# Patient Record
Sex: Male | Born: 1955 | Race: Black or African American | Hispanic: No | Marital: Married | Smoking: Never smoker
Health system: Southern US, Community
[De-identification: ages and names within clinical notes are randomized; demographics above are authoritative.]

## PROBLEM LIST (undated history)

## (undated) DIAGNOSIS — T7840XA Allergy, unspecified, initial encounter: Secondary | ICD-10-CM

## (undated) DIAGNOSIS — K219 Gastro-esophageal reflux disease without esophagitis: Secondary | ICD-10-CM

## (undated) DIAGNOSIS — E785 Hyperlipidemia, unspecified: Secondary | ICD-10-CM

## (undated) HISTORY — DX: Hyperlipidemia, unspecified: E78.5

## (undated) HISTORY — DX: Gastro-esophageal reflux disease without esophagitis: K21.9

## (undated) HISTORY — DX: Allergy, unspecified, initial encounter: T78.40XA

## (undated) HISTORY — PX: TONSILLECTOMY AND ADENOIDECTOMY: SUR1326

## (undated) HISTORY — PX: OTHER SURGICAL HISTORY: SHX169

---

## 1985-09-16 HISTORY — PX: PALATE / UVULA BIOPSY / EXCISION: SUR128

## 1999-06-19 ENCOUNTER — Emergency Department (HOSPITAL_COMMUNITY): Admission: EM | Admit: 1999-06-19 | Discharge: 1999-06-19 | Payer: Self-pay | Admitting: Emergency Medicine

## 1999-06-19 ENCOUNTER — Encounter: Payer: Self-pay | Admitting: Emergency Medicine

## 2000-08-16 ENCOUNTER — Emergency Department (HOSPITAL_COMMUNITY): Admission: EM | Admit: 2000-08-16 | Discharge: 2000-08-16 | Payer: Self-pay | Admitting: Internal Medicine

## 2003-12-05 ENCOUNTER — Encounter: Admission: RE | Admit: 2003-12-05 | Discharge: 2003-12-05 | Payer: Self-pay | Admitting: Internal Medicine

## 2004-03-30 ENCOUNTER — Encounter: Payer: Self-pay | Admitting: Internal Medicine

## 2004-08-03 ENCOUNTER — Ambulatory Visit: Payer: Self-pay | Admitting: Family Medicine

## 2005-05-10 ENCOUNTER — Ambulatory Visit: Payer: Self-pay | Admitting: Internal Medicine

## 2005-05-23 ENCOUNTER — Ambulatory Visit: Payer: Self-pay | Admitting: Internal Medicine

## 2006-02-18 ENCOUNTER — Ambulatory Visit: Payer: Self-pay | Admitting: Internal Medicine

## 2006-03-20 ENCOUNTER — Ambulatory Visit: Payer: Self-pay | Admitting: Internal Medicine

## 2006-06-04 ENCOUNTER — Ambulatory Visit: Payer: Self-pay | Admitting: Internal Medicine

## 2006-06-11 ENCOUNTER — Ambulatory Visit: Payer: Self-pay | Admitting: Internal Medicine

## 2006-12-24 ENCOUNTER — Ambulatory Visit: Payer: Self-pay | Admitting: Internal Medicine

## 2006-12-24 LAB — CONVERTED CEMR LAB
ALT: 65 units/L — ABNORMAL HIGH (ref 0–40)
AST: 43 units/L — ABNORMAL HIGH (ref 0–37)
Cholesterol: 166 mg/dL (ref 0–200)
HDL: 45.2 mg/dL (ref >39.0)
Hgb A1c MFr Bld: 6 % (ref 4.6–6.0)
LDL Cholesterol: 101 mg/dL — ABNORMAL HIGH (ref 0–99)
Total CHOL/HDL Ratio: 3.7
Triglycerides: 100 mg/dL (ref 0–149)
VLDL: 20 mg/dL (ref 0–40)

## 2007-01-07 ENCOUNTER — Ambulatory Visit: Payer: Self-pay | Admitting: Internal Medicine

## 2007-05-04 ENCOUNTER — Ambulatory Visit: Payer: Self-pay | Admitting: Internal Medicine

## 2007-05-12 ENCOUNTER — Encounter (INDEPENDENT_AMBULATORY_CARE_PROVIDER_SITE_OTHER): Payer: Self-pay | Admitting: *Deleted

## 2007-05-12 ENCOUNTER — Ambulatory Visit: Payer: Self-pay | Admitting: Internal Medicine

## 2007-05-12 LAB — CONVERTED CEMR LAB
ALT: 30 units/L (ref 0–53)
AST: 27 units/L (ref 0–37)
Cholesterol, target level: 200 mg/dL
Cholesterol: 121 mg/dL (ref 0–200)
Creatinine,U: 299.4 mg/dL
HDL goal, serum: 40 mg/dL
HDL: 39.9 mg/dL (ref >39.0)
Hgb A1c MFr Bld: 6 % (ref 4.6–6.0)
LDL Cholesterol: 72 mg/dL (ref 0–99)
LDL Goal: 130 mg/dL
Microalb Creat Ratio: 2.7 mg/g (ref 0.0–30.0)
Microalb, Ur: 0.8 mg/dL (ref 0.0–1.9)
PSA: 0.27 ng/mL (ref 0.10–4.00)
Total CHOL/HDL Ratio: 3
Triglycerides: 45 mg/dL (ref 0–149)
VLDL: 9 mg/dL (ref 0–40)

## 2007-08-18 ENCOUNTER — Telehealth (INDEPENDENT_AMBULATORY_CARE_PROVIDER_SITE_OTHER): Payer: Self-pay | Admitting: *Deleted

## 2008-05-11 ENCOUNTER — Ambulatory Visit: Payer: Self-pay | Admitting: Internal Medicine

## 2008-05-16 LAB — CONVERTED CEMR LAB
Cholesterol: 208 mg/dL (ref 0–200)
Direct LDL: 117.4 mg/dL
HDL: 52.5 mg/dL (ref >39.0)
Hgb A1c MFr Bld: 6 % (ref 4.6–6.0)
Total CHOL/HDL Ratio: 4
Triglycerides: 113 mg/dL (ref 0–149)
VLDL: 23 mg/dL (ref 0–40)

## 2008-05-17 ENCOUNTER — Encounter (INDEPENDENT_AMBULATORY_CARE_PROVIDER_SITE_OTHER): Payer: Self-pay | Admitting: *Deleted

## 2008-05-26 ENCOUNTER — Encounter (INDEPENDENT_AMBULATORY_CARE_PROVIDER_SITE_OTHER): Payer: Self-pay | Admitting: *Deleted

## 2008-05-26 ENCOUNTER — Ambulatory Visit: Payer: Self-pay | Admitting: Internal Medicine

## 2008-05-26 DIAGNOSIS — E785 Hyperlipidemia, unspecified: Secondary | ICD-10-CM | POA: Insufficient documentation

## 2008-05-26 DIAGNOSIS — J309 Allergic rhinitis, unspecified: Secondary | ICD-10-CM | POA: Insufficient documentation

## 2008-05-26 DIAGNOSIS — R7301 Impaired fasting glucose: Secondary | ICD-10-CM | POA: Insufficient documentation

## 2008-05-26 DIAGNOSIS — R5381 Other malaise: Secondary | ICD-10-CM

## 2008-05-26 DIAGNOSIS — R5383 Other fatigue: Secondary | ICD-10-CM

## 2008-05-26 LAB — CONVERTED CEMR LAB
Cholesterol, target level: 200 mg/dL
HDL goal, serum: 40 mg/dL
LDL Goal: 100 mg/dL

## 2008-05-27 ENCOUNTER — Encounter (INDEPENDENT_AMBULATORY_CARE_PROVIDER_SITE_OTHER): Payer: Self-pay | Admitting: *Deleted

## 2008-05-27 LAB — CONVERTED CEMR LAB
Basophils Absolute: 0.1 10*3/uL (ref 0.0–0.1)
Basophils Relative: 1.3 % (ref 0.0–3.0)
Eosinophils Absolute: 0.1 10*3/uL (ref 0.0–0.7)
Eosinophils Relative: 1.7 % (ref 0.0–5.0)
Free T4: 0.8 ng/dL (ref 0.6–1.6)
HCT: 42.1 % (ref 39.0–52.0)
Hemoglobin: 14.5 g/dL (ref 13.0–17.0)
Lymphocytes Relative: 38 % (ref 12.0–46.0)
MCHC: 34.3 g/dL (ref 30.0–36.0)
MCV: 93.3 fL (ref 78.0–100.0)
Monocytes Absolute: 0.3 10*3/uL (ref 0.1–1.0)
Monocytes Relative: 8 % (ref 3.0–12.0)
Neutro Abs: 1.9 10*3/uL (ref 1.4–7.7)
Neutrophils Relative %: 51 % (ref 43.0–77.0)
PSA: 0.3 ng/mL (ref 0.10–4.00)
Platelets: 162 10*3/uL (ref 150–400)
RBC: 4.51 M/uL (ref 4.22–5.81)
RDW: 12.5 % (ref 11.5–14.6)
TSH: 0.97 microintl units/mL (ref 0.35–5.50)
WBC: 3.9 10*3/uL — ABNORMAL LOW (ref 4.5–10.5)

## 2008-05-31 ENCOUNTER — Encounter: Payer: Self-pay | Admitting: Internal Medicine

## 2008-06-03 ENCOUNTER — Ambulatory Visit: Payer: Self-pay | Admitting: *Deleted

## 2008-06-27 ENCOUNTER — Ambulatory Visit: Payer: Self-pay | Admitting: Gastroenterology

## 2008-07-08 ENCOUNTER — Encounter: Payer: Self-pay | Admitting: Gastroenterology

## 2008-07-08 ENCOUNTER — Ambulatory Visit: Payer: Self-pay | Admitting: Gastroenterology

## 2008-07-11 ENCOUNTER — Encounter: Payer: Self-pay | Admitting: Gastroenterology

## 2008-08-16 ENCOUNTER — Ambulatory Visit: Payer: Self-pay | Admitting: Internal Medicine

## 2008-08-19 ENCOUNTER — Telehealth (INDEPENDENT_AMBULATORY_CARE_PROVIDER_SITE_OTHER): Payer: Self-pay | Admitting: *Deleted

## 2008-10-19 ENCOUNTER — Ambulatory Visit: Payer: Self-pay | Admitting: Internal Medicine

## 2008-10-19 DIAGNOSIS — IMO0002 Reserved for concepts with insufficient information to code with codable children: Secondary | ICD-10-CM

## 2008-10-19 LAB — CONVERTED CEMR LAB
Bilirubin Urine: NEGATIVE
Blood in Urine, dipstick: NEGATIVE
Glucose, Urine, Semiquant: NEGATIVE
Ketones, urine, test strip: NEGATIVE
Nitrite: NEGATIVE
Protein, U semiquant: NEGATIVE
Specific Gravity, Urine: 1.015
Urobilinogen, UA: 0.2
WBC Urine, dipstick: NEGATIVE
pH: 6

## 2009-07-04 ENCOUNTER — Ambulatory Visit: Payer: Self-pay | Admitting: Family Medicine

## 2009-07-19 ENCOUNTER — Ambulatory Visit: Payer: Self-pay | Admitting: Internal Medicine

## 2009-07-21 ENCOUNTER — Encounter (INDEPENDENT_AMBULATORY_CARE_PROVIDER_SITE_OTHER): Payer: Self-pay | Admitting: *Deleted

## 2009-10-24 ENCOUNTER — Telehealth (INDEPENDENT_AMBULATORY_CARE_PROVIDER_SITE_OTHER): Payer: Self-pay | Admitting: *Deleted

## 2009-10-24 ENCOUNTER — Encounter (INDEPENDENT_AMBULATORY_CARE_PROVIDER_SITE_OTHER): Payer: Self-pay | Admitting: *Deleted

## 2009-11-06 ENCOUNTER — Telehealth (INDEPENDENT_AMBULATORY_CARE_PROVIDER_SITE_OTHER): Payer: Self-pay | Admitting: *Deleted

## 2009-11-10 ENCOUNTER — Ambulatory Visit: Payer: Self-pay | Admitting: Internal Medicine

## 2009-11-10 LAB — CONVERTED CEMR LAB
Alkaline Phosphatase: 38 units/L — ABNORMAL LOW (ref 39–117)
BUN: 16 mg/dL (ref 6–23)
Basophils Absolute: 0 10*3/uL (ref 0.0–0.1)
Bilirubin, Direct: 0 mg/dL (ref 0.0–0.3)
Blood in Urine, dipstick: NEGATIVE
CO2: 28 meq/L (ref 19–32)
Chloride: 108 meq/L (ref 96–112)
Creatinine, Ser: 1.2 mg/dL (ref 0.4–1.5)
Eosinophils Absolute: 0.1 10*3/uL (ref 0.0–0.7)
Eosinophils Relative: 2.3 % (ref 0.0–5.0)
Glucose, Bld: 95 mg/dL (ref 70–99)
HCT: 43.5 % (ref 39.0–52.0)
HDL: 56.9 mg/dL (ref >39.00)
Hgb A1c MFr Bld: 5.9 % (ref 4.6–6.5)
Ketones, urine, test strip: NEGATIVE
Lymphs Abs: 1.5 10*3/uL (ref 0.7–4.0)
MCHC: 32.8 g/dL (ref 30.0–36.0)
MCV: 95.2 fL (ref 78.0–100.0)
Microalb Creat Ratio: 3.3 mg/g (ref 0.0–30.0)
Monocytes Absolute: 0.3 10*3/uL (ref 0.1–1.0)
Neutrophils Relative %: 50.9 % (ref 43.0–77.0)
Nitrite: NEGATIVE
PSA: 0.47 ng/mL (ref 0.10–4.00)
Platelets: 156 10*3/uL (ref 150.0–400.0)
Potassium: 3.9 meq/L (ref 3.5–5.1)
Protein, U semiquant: NEGATIVE
RDW: 12.5 % (ref 11.5–14.6)
Specific Gravity, Urine: 1.02
Total Bilirubin: 0.4 mg/dL (ref 0.3–1.2)
Total CHOL/HDL Ratio: 3
Total Protein: 7.3 g/dL (ref 6.0–8.3)
Triglycerides: 112 mg/dL (ref 0.0–149.0)
VLDL: 22.4 mg/dL (ref 0.0–40.0)
WBC Urine, dipstick: NEGATIVE
WBC: 3.8 10*3/uL — ABNORMAL LOW (ref 4.5–10.5)
pH: 5

## 2009-11-16 ENCOUNTER — Ambulatory Visit: Payer: Self-pay | Admitting: Internal Medicine

## 2009-11-16 DIAGNOSIS — D126 Benign neoplasm of colon, unspecified: Secondary | ICD-10-CM

## 2010-01-26 IMAGING — CR DG CHEST 2V
2 series · 2 of 2 positions shown · non-contrast
Comparison: None

CLINICAL DATA: Bronchitis

CHEST - 2 VIEW

[view not recorded (1 of 2)]
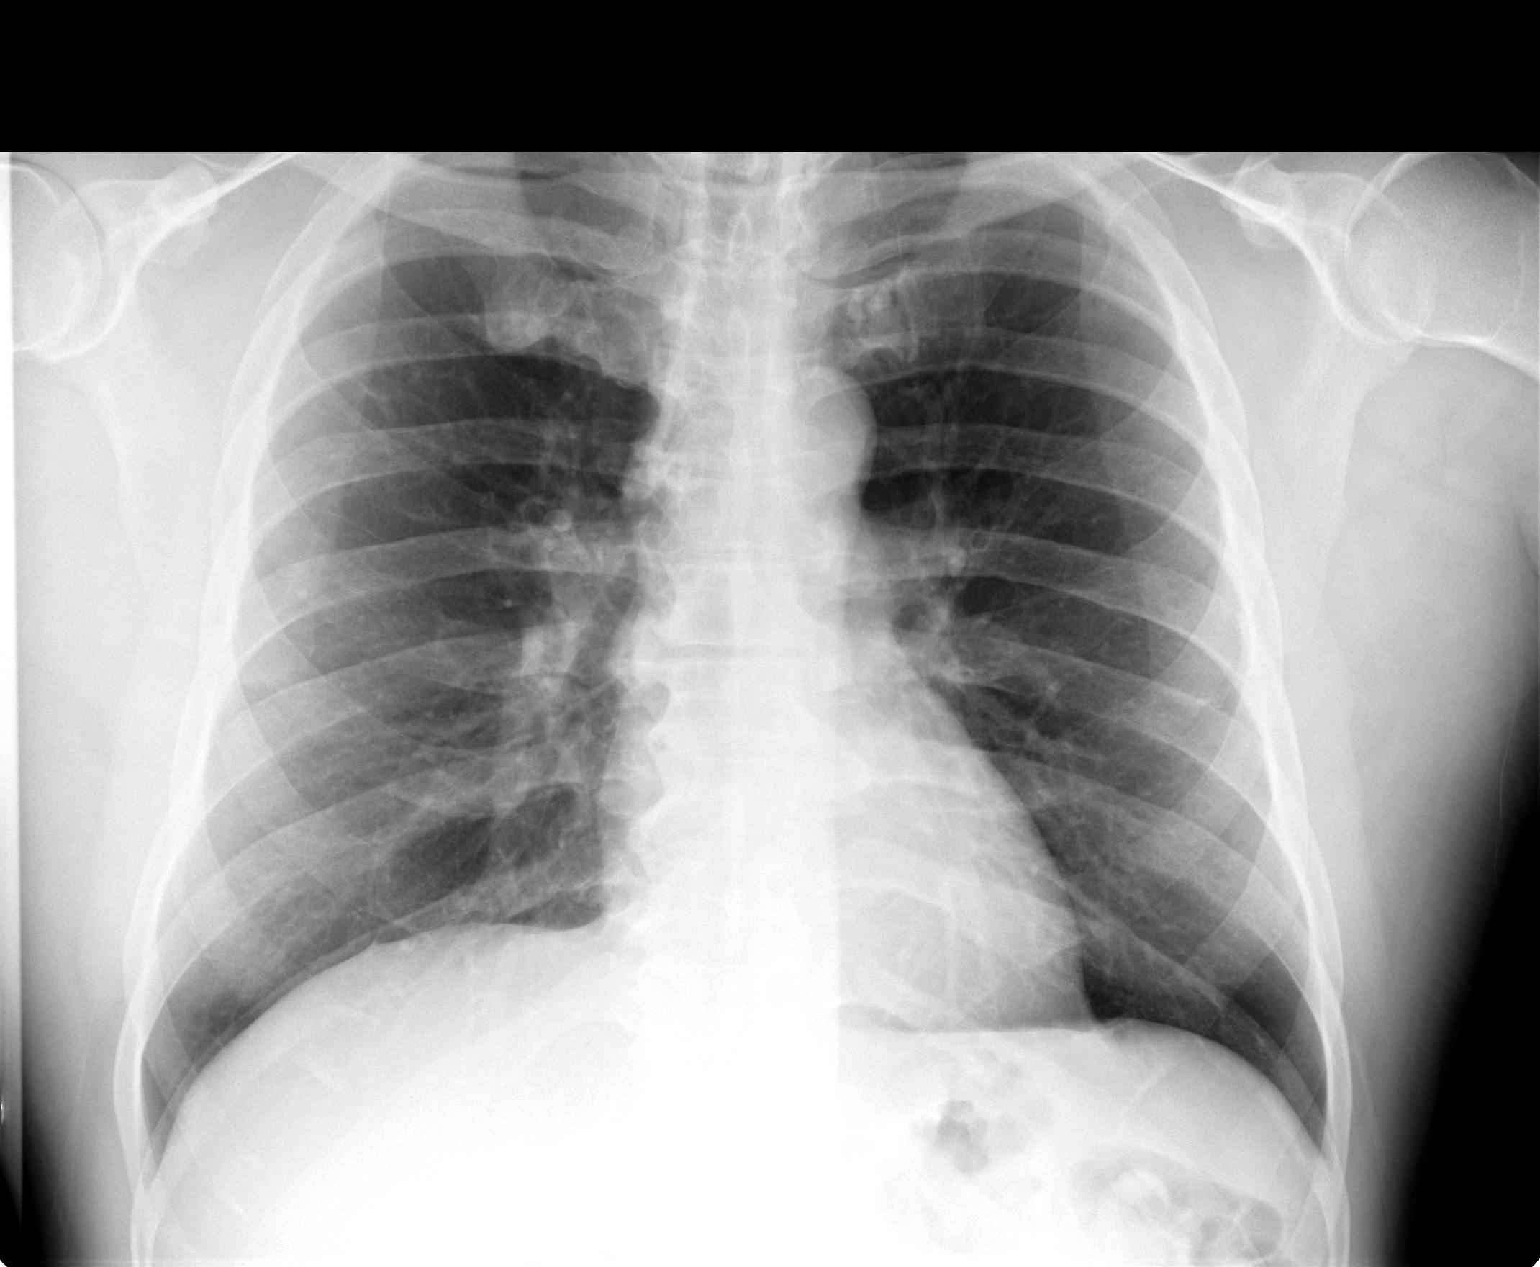

[view not recorded (2 of 2)]
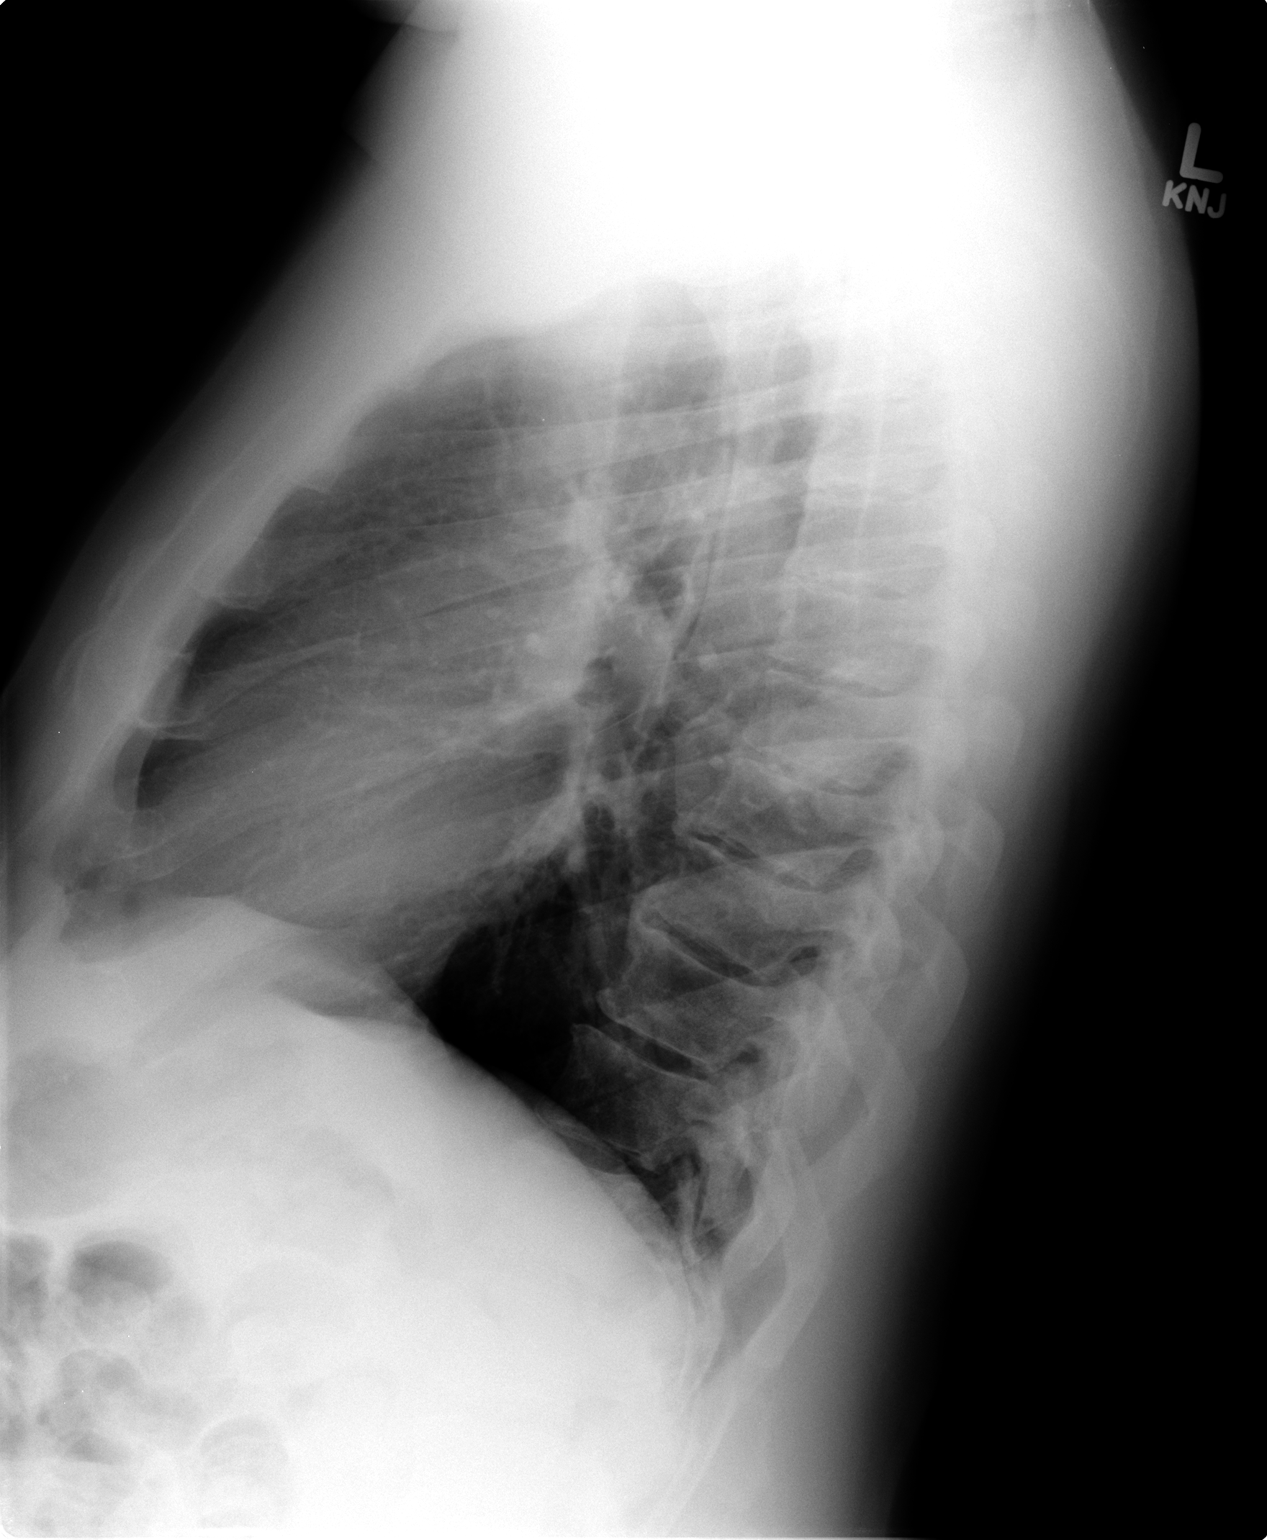

[2 of 2 positions shown; findings below may reference images not displayed]

FINDINGS: The heart size and mediastinal contours are within normal limits.
Both lungs are clear.  The visualized skeletal structures are
unremarkable.
IMPRESSION: No active disease.

## 2010-02-14 ENCOUNTER — Ambulatory Visit: Payer: Self-pay | Admitting: Internal Medicine

## 2010-02-14 DIAGNOSIS — M5412 Radiculopathy, cervical region: Secondary | ICD-10-CM | POA: Insufficient documentation

## 2010-04-22 ENCOUNTER — Emergency Department (HOSPITAL_BASED_OUTPATIENT_CLINIC_OR_DEPARTMENT_OTHER): Admission: EM | Admit: 2010-04-22 | Discharge: 2010-04-22 | Payer: Self-pay | Admitting: Emergency Medicine

## 2010-09-14 ENCOUNTER — Ambulatory Visit
Admission: RE | Admit: 2010-09-14 | Discharge: 2010-09-14 | Payer: Self-pay | Source: Home / Self Care | Attending: Internal Medicine | Admitting: Internal Medicine

## 2010-09-14 DIAGNOSIS — J019 Acute sinusitis, unspecified: Secondary | ICD-10-CM | POA: Insufficient documentation

## 2010-09-14 LAB — CONVERTED CEMR LAB: Rapid Strep: NEGATIVE

## 2010-10-16 NOTE — Assessment & Plan Note (Signed)
Summary: cpx/kdc   Vital Signs:  Patient profile:   55 year old male Height:      68 inches Weight:      200.4 pounds Temp:     98.7 degrees F oral Resp:     14 per minute BP sitting:   98 / 60  (left arm) Cuff size:   large  Vitals Entered By: Shonna Chock (November 16, 2009 3:55 PM)  Comments REVIEWED MED LIST, PATIENT AGREED DOSE AND INSTRUCTION CORRECT    Primary Care Provider:  Marga Melnick MD   History of Present Illness: Jason Bowman is here for a physical; he is essentially asymptomatic except for nasal congestion, a perrenial issue.  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 ENT:  Complains of nasal congestion and sinus pressure; denies decreased hearing, difficulty swallowing, and hoarseness; No purulence,facial pain or frontal headache. CV:  Denies chest pain or discomfort, leg cramps with exertion, palpitations, and shortness of breath with exertion. Resp:  Denies cough, excessive snoring, hypersomnolence, morning headaches, shortness of breath, sputum productive, and wheezing. GI:  Denies abdominal pain, bloody stools, dark tarry stools, and indigestion. GU:  Denies discharge, dysuria, and hematuria. MS:  Complains of low back pain; denies joint pain, joint redness, joint swelling, mid back pain, and  thoracic pain. Derm:  Denies changes in nail beds, dryness, and hair loss. Neuro:  Denies brief paralysis, numbness, tingling, and weakness; No radicular symptoms. Psych:  Denies anxiety and depression. Endo:  Denies cold intolerance, excessive hunger, excessive thirst, excessive urination, and heat intolerance. Heme:  Denies abnormal bruising and bleeding. Allergy:  Denies itching eyes and sneezing.  Physical Exam  General:  well-nourished,in no acute distress; alert,appropriate and cooperative throughout examination Head:  Normocephalic and atraumatic without obvious abnormalities. Moustache & beard Eyes:  No corneal or conjunctival inflammation noted. Perrla. Funduscopic exam benign, without hemorrhages, exudates or papilledema.  Ears:  External ear exam shows no significant lesions or deformities.  Otoscopic examination reveals clear canals, tympanic membranes are intact bilaterally without bulging, retraction, inflammation or discharge. Hearing is grossly normal bilaterally. Nose:  External nasal examination shows no deformity or inflammation. Nasal mucosa are pink and moist without lesions or exudates. Mouth:  Oral mucosa and oropharynx without lesions or exudates.  Teeth in good repair.S/P posterior pharyngeal surgery Neck:  No deformities, masses, or tenderness noted. Lungs:  Normal respiratory effort, chest expands symmetrically. Lungs are clear to auscultation, no crackles or wheezes. Heart:  Normal rate and regular rhythm. S1 and S2 normal without gallop, murmur, click, rub . S4 Abdomen:  Bowel sounds positive,abdomen soft and non-tender without masses, organomegaly or hernias noted. Rectal:  No external abnormalities noted. Normal sphincter tone. No rectal masses or tenderness. Genitalia:  Testes bilaterally descended without nodularity, tenderness or masses. No scrotal masses or lesions. No penis lesions or urethral discharge. Prostate:  Prostate gland firm and smooth,  no  enlargement, nodularity, tenderness, mass, asymmetry or induration. Msk:  No deformity or scoliosis noted of thoracic or lumbar spine.   Pulses:  R and L carotid,radial,dorsalis pedis and posterior tibial pulses are full and equal bilaterally Extremities:  No clubbing, cyanosis, edema, or deformity noted with normal full range of motion of all joints.  Mild crepitus of knees  Neurologic:  alert & oriented X3, strength normal in all extremities, and DTRs symmetrical , 1/2 + @ knees  Skin:  Small papule L cheek. Hyperpigmentation inguinal areas. Fleshy polyp buttock Cervical Nodes:  No lymphadenopathy noted Axillary Nodes:  No palpable lymphadenopathy Psych:  memory intact for recent and remote, normally interactive, and good eye contact.     Impression & Recommendations:  Problem # 1:  ROUTINE GENERAL MEDICAL EXAM@HEALTH  CARE FACL (ICD-V70.0)  Orders: EKG w/ Interpretation (93000)  Problem # 2:  HYPERLIPIDEMIA (ICD-272.4)  Lipids are @ goal His updated medication list for this problem includes:    Vytorin 10-20 Mg Tabs (Ezetimibe-simvastatin) .Marland Kitchen... 1 at bedtime. appointment due for additional refills  Orders: EKG w/ Interpretation (93000)  Problem # 3:  COLONIC POLYPS, HX OF (ICD-V12.72) Colonoscopy 2015  Problem # 4:  HYPERGLYCEMIA, FASTING (ICD-790.29) FBS now 95, A1c 5.9%  Complete Medication List: 1)  Vytorin 10-20 Mg Tabs (Ezetimibe-simvastatin) .Marland Kitchen.. 1 at bedtime. appointment due for additional refills 2)  Allegra 180 Mg Tabs (Fexofenadine hcl) .Marland Kitchen.. 1 by mouth once daily as needed 3)  Multivitamins Tabs (Multiple vitamin) .Marland Kitchen.. 1 by mouth once daily 4)  Adult Aspirin Low Strength 81 Mg Tbdp (Aspirin) .Marland Kitchen.. 1 by mouth once daily 5)  Rhinocort Aqua 32 Mcg/act Susp (Budesonide) .... As directed  Patient Instructions: 1)  Please schedule a follow-up appointment in 1 year or as needed . Neti pot once daily as needed for sinus congestion Prescriptions: VYTORIN 10-20 MG  TABS  (EZETIMIBE-SIMVASTATIN) 1 at bedtime. APPOINTMENT DUE FOR ADDITIONAL REFILLS  #30 x 11   Entered and Authorized by:   Marga Melnick MD   Signed by:   Marga Melnick MD on 11/16/2009   Method used:   Print then Give to Patient   RxID:   [id number]

## 2010-10-16 NOTE — Assessment & Plan Note (Signed)
Summary: pain in right arm//lch   Vital Signs:  Patient profile:   55 year old male Weight:      195.2 pounds Temp:     98.9 degrees F oral Pulse rate:   72 / minute Resp:     15 per minute BP sitting:   100 / 62  (left arm) Cuff size:   large  Vitals Entered By: Shonna Chock (February 14, 2010 4:26 PM) CC: Right arm pain x 1 month 1/2, no known injury, Back pain Comments REVIEWED MED LIST, PATIENT AGREED DOSE AND INSTRUCTION CORRECT    Primary Care Provider:  Marga Melnick MD  CC:  Right arm pain x 1 month 1/2, no known injury, and Back pain.  History of Present Illness:  Back Pain      This is a 55 year old man who presents with Back pain for > 6 weeks.  The patient reports rest pain, but denies fever, chills, weakness, loss of sensation, fecal incontinence, urinary incontinence, urinary retention, dysuria, inability to work, and inability to care for self.  The pain is located in the mid- right thoracic back.  The pain began at home, gradually, and  possibly after overuse, lifting boxes & sleeping in different , softer bed for 2 months.  The pain radiates to the right UE in ulnar distribution.  The pain is made worse by activity, specifically using RUE such as typing @ compute.  The pain is made better by NSAID medications.  Risk factors for serious underlying conditions include duration of pain > 1 month and age >= 50 years.    Allergies: 1)  Celebrex 2)  * Crestor  Review of Systems General:  Denies chills and weight loss. MS:  Denies joint pain, joint redness, and joint swelling. Derm:  Denies lesion(s) and rash. Neuro:  Denies brief paralysis, numbness, and weakness.  Physical Exam  General:  well-nourished,in no acute distress; alert,appropriate and cooperative throughout examination Neck:  No deformities, masses, or tenderness noted. Full ROM Lungs:  Normal respiratory effort, chest expands symmetrically. Lungs are clear to auscultation, no crackles or wheezes. Heart:   Normal rate and regular rhythm. S1 and S2 normal without gallop, murmur, click, rub . S4 Abdomen:  Bowel sounds positive,abdomen soft and non-tender without masses, organomegaly or hernias noted. Pulses:  R and L carotid,radial  pulses are full and equal bilaterally Extremities:  No clubbing, cyanosis, edema, or deformity noted with normal full range of motion of all joints.   Neurologic:  alert & oriented X3, strength normal in all extremities, gait normal, and DTRs symmetrical and normal.   Skin:  Intact without suspicious lesions or rashes Cervical Nodes:  No lymphadenopathy noted Axillary Nodes:  No palpable lymphadenopathy but ticklish Psych:  memory intact for recent and remote and normally interactive.     Impression & Recommendations:  Problem # 1:  CERVICAL RADICULOPATHY, RIGHT (ICD-723.4)   @ C-8 w/o muscle weakness or abnormal DTRs  Orders: Chiropractic Referral ([place])  Complete Medication List: 1)  Vytorin 10-20 Mg Tabs (Ezetimibe-simvastatin) .Marland Kitchen.. 1 at bedtime. 2)  Allegra 180 Mg Tabs (Fexofenadine hcl) .Marland Kitchen.. 1 by mouth once daily as needed 3)  Multivitamins Tabs (Multiple vitamin) .Marland Kitchen.. 1 by mouth once daily 4)  Adult Aspirin Low Strength 81 Mg Tbdp (Aspirin) .Marland Kitchen.. 1 by mouth once daily 5)  Rhinocort Aqua 32 Mcg/act Susp (Budesonide) .... As directed 6)  Tramadol Hcl 50 Mg Tabs (Tramadol hcl) .Marland Kitchen.. 1 every 6 hrs as needed back  pain  Patient Instructions: 1)  Use a concave neck  pillow @ night. Adjust computer height to decrease symptoms; employ Ergonomics  expert if available. Prescriptions: TRAMADOL HCL 50 MG TABS (TRAMADOL HCL) 1 every 6 hrs as needed back pain  #30 x 1   Entered and Authorized by:   Marga Melnick MD   Signed by:   Marga Melnick MD on 02/14/2010   Method used:   Faxed to ...       [facility] [phone]* (retail)       [address]       [place], [place]  [address]       Ph: [phone]       Fax: [phone]   RxID:    [id number]   Prevention & Chronic Care Immunizations   Influenza vaccine: Fluvax 3+  (07/19/2009)    Tetanus booster: 03/04/2006: given    Pneumococcal vaccine: Not documented  Colorectal Screening   Hemoccult: Not documented    Colonoscopy: Location:  [facility].    (07/08/2008)   Colonoscopy due: 07/2013  Other Screening   PSA: 0.47  (11/10/2009)   Smoking status: never  (05/26/2008)  Lipids   Total Cholesterol: 157  (11/10/2009)   LDL: 78  (11/10/2009)   LDL Direct: 117.4  (05/11/2008)   HDL: 56.90  (11/10/2009)   Triglycerides: 112.0  (11/10/2009)    SGOT (AST): 30  (11/10/2009)   SGPT (ALT): 31  (11/10/2009)   Alkaline phosphatase: 38  (11/10/2009)   Total bilirubin: 0.4  (11/10/2009)  Self-Management Support :    Lipid self-management support: Not documented

## 2010-10-16 NOTE — Letter (Signed)
Summary: Primary Care Appointment Letter  [facility]  [address] [place], [place] [address]   Phone: [phone]  Fax: [phone]    10/24/2009 MRN: [id number]  Romy Ch[place]ua [address] [place], [place]  [address]  Dear Mr. Booton,   Your Primary Care Physician Marga Melnick MD has indicated that:    ___x____it is time to schedule an appointment. Please call and schedule a physical.    _______you missed your appointment on______ and need to call and          reschedule.    _______you need to have lab work done.    _______you need to schedule an appointment discuss lab or test results.    _______you need to call to reschedule your appointment that is                       scheduled on _________.     Please call our office as soon as possible. Our phone number is [phone]          _________. Please press option 1. Our office is open 8a-12noon and 1p-5p, Monday through Friday.     Thank you,    [facility]

## 2010-10-16 NOTE — Progress Notes (Signed)
Summary: cpx labs  Phone Note Call from Patient   Caller: Patient Summary of Call: Patient has an appt on March 3,2011 for a cpx and labs scheduled for Feb 25,2011. Need lab orders please Initial call taken by: Barb Merino,  November 06, 2009 11:23 AM  Follow-up for Phone Call        lipid,a1c,psa,micoalumin,tsh,hep,bmp,cbc,ua,277.7,995.2,600.9,v70.0..................Marland KitchenFelecia Deloach CMA  November 06, 2009 12:45 PM     Additional Follow-up for Phone Call Additional follow up Details #2::    labs are scheduled Follow-up by: Barb Merino,  November 06, 2009 12:53 PM

## 2010-10-16 NOTE — Progress Notes (Signed)
Summary: Appointment Due  Phone Note Outgoing Call Call back at [facility] Phone [phone] Call back at Work Phone [phone]   Summary of Call: Please call patient and schedule a CPX and fasting labs within the next 90 days. This is necessary to continue refilling Chlosterol meds   .Shonna Chock  October 24, 2009 11:10 AM     Additional Follow-up for Phone Call Additional follow up Details #2::    mailed a letter.Marland KitchenMarland KitchenMarland KitchenBarb Merino  October 24, 2009 11:43 AM  Follow-up by: Barb Merino,  October 24, 2009 11:43 AM

## 2010-10-18 NOTE — Assessment & Plan Note (Signed)
Summary: sore throat, cough, ///sph   Vital Signs:  Patient profile:   55 year old male Weight:      197 pounds BMI:     30.06 Temp:     98.6 degrees F oral Pulse rate:   64 / minute Resp:     14 per minute BP sitting:   120 / 72  (left arm) Cuff size:   large  Vitals Entered By: Shonna Chock CMA (September 14, 2010 12:44 PM) CC: Sore throat, low-grade fever and coung since Monday. Patient tried OTC meds: Robitussin, Mucinex, and Advil, URI symptoms   Primary Care Provider:  Marga Melnick MD  CC:  Sore throat, low-grade fever and coung since Monday. Patient tried OTC meds: Robitussin, Mucinex, and Advil, and URI symptoms.  History of Present Illness:      This is a 55 year old man who presents with RTI  symptoms; onset 12/26 as ST .  The patient now  reports nasal congestion, thick purulent nasal discharge, persistent sore throat, and productive coughwith thick green sputum, but denies earache.  Associated symptoms include low-grade fever (<100.5 degrees).  The patient denies dyspnea and wheezing.  The patient denies headache& bilateral facial pain, tooth pain, Strep exposure, and tender adenopathy.  Rx: Advil, Mucinex DM, Robitussin CF.  Current Medications (verified): 1)  Vytorin 10-20 Mg  Tabs (Ezetimibe-Simvastatin) .Marland Kitchen.. 1 At Bedtime. 2)  Allegra 180 Mg Tabs (Fexofenadine Hcl) .Marland Kitchen.. 1 By Mouth Once Daily As Needed 3)  Multivitamins  Tabs (Multiple Vitamin) .Marland Kitchen.. 1 By Mouth Once Daily 4)  Adult Aspirin Low Strength 81 Mg Tbdp (Aspirin) .Marland Kitchen.. 1 By Mouth Once Daily 5)  Rhinocort Aqua 32 Mcg/act Susp (Budesonide) .... As Directed 6)  Tramadol Hcl 50 Mg Tabs (Tramadol Hcl) .Marland Kitchen.. 1 Every 6 Hrs As Needed Back Pain  Allergies: 1)  Celebrex 2)  * Crestor  Physical Exam  General:  well-nourished,in no acute distress; alert,appropriate and cooperative throughout examination Ears:  External ear exam shows no significant lesions or deformities.  Otoscopic examination reveals clear canals,  tympanic membranes are intact bilaterally without bulging, retraction, inflammation or discharge. Hearing is grossly normal bilaterally. Nose:  External nasal examination shows no deformity or inflammation. Nasal mucosa are pink and moist without lesions or exudates. Mouth:  Oral mucosa and oropharynx without lesions or exudates.  Teeth in good repair.S/P uvuletomy ; minimal pharyngeal erythema.   Lungs:  Normal respiratory effort, chest expands symmetrically. Lungs are clear to auscultation, no crackles or wheezes. Cervical Nodes:  Shotty LA Axillary Nodes:  No palpable lymphadenopathy   Impression & Recommendations:  Problem # 1:  BRONCHITIS-ACUTE (ICD-466.0)  His updated medication list for this problem includes:    Amoxicillin-pot Clavulanate 875-125 Mg Tabs (Amoxicillin-pot clavulanate) .Marland Kitchen... 1 every 12 hrs with a meal  Problem # 2:  SINUSITIS- ACUTE-NOS (ICD-461.9)  His updated medication list for this problem includes:    Rhinocort Aqua 32 Mcg/act Susp (Budesonide) .Marland Kitchen... As directed    Amoxicillin-pot Clavulanate 875-125 Mg Tabs (Amoxicillin-pot clavulanate) .Marland Kitchen... 1 every 12 hrs with a meal  Complete Medication List: 1)  Vytorin 10-20 Mg Tabs (Ezetimibe-simvastatin) .Marland Kitchen.. 1 at bedtime. 2)  Allegra 180 Mg Tabs (Fexofenadine hcl) .Marland Kitchen.. 1 by mouth once daily as needed 3)  Multivitamins Tabs (Multiple vitamin) .Marland Kitchen.. 1 by mouth once daily 4)  Adult Aspirin Low Strength 81 Mg Tbdp (Aspirin) .Marland Kitchen.. 1 by mouth once daily 5)  Rhinocort Aqua 32 Mcg/act Susp (Budesonide) .... As directed 6)  Tramadol Hcl  50 Mg Tabs (Tramadol hcl) .Marland Kitchen.. 1 every 6 hrs as needed back pain 7)  Amoxicillin-pot Clavulanate 875-125 Mg Tabs (Amoxicillin-pot clavulanate) .Marland Kitchen.. 1 every 12 hrs with a meal  Other Orders: Rapid Strep (21308)  Patient Instructions: 1)  Neti pot once daily - two times a day  as needed  followed by the Rhinocort. 2)  Drink as much NON dairy  fluid as you can tolerate for the next few  days. Prescriptions: AMOXICILLIN-POT CLAVULANATE 875-125 MG TABS (AMOXICILLIN-POT CLAVULANATE) 1 every 12 hrs with a meal  #20 x 0   Entered and Authorized by:   Marga Melnick MD   Signed by:   Marga Melnick MD on 09/14/2010   Method used:   Faxed to ...       [facility] [phone]* (retail)       [address]       [place], [place]  [address]       Ph: [phone]       Fax: [phone]   RxID:   [id number]    Orders Added: 1)  Rapid Strep [64332] 2)  Est. Patient Level III [95188]    Laboratory Results    Other Tests  Rapid Strep: negative

## 2010-10-25 ENCOUNTER — Telehealth: Payer: Self-pay | Admitting: Internal Medicine

## 2010-11-01 NOTE — Progress Notes (Signed)
Summary: Continued cough and rx request  Phone Note Call from Patient   Summary of Call: Patient left message on triage noting that he still has slight cough from December office visit that is worse at night. Patient notes that he did finish his ABX. I made patient aware that Dr. Alwyn Ren policy is that if it has been more than 2 weeks the patient needs to come in for office visit. Patient expressed understanding and will discuss it with his wife and call back if appt is needed. Initial call taken by: Lucious Groves CMA,  October 25, 2010 4:56 PM

## 2010-11-30 LAB — BASIC METABOLIC PANEL
BUN: 13 mg/dL (ref 6–23)
Calcium: 9 mg/dL (ref 8.4–10.5)
Chloride: 104 mEq/L (ref 96–112)
Creatinine, Ser: 0.9 mg/dL (ref 0.4–1.5)
GFR calc Af Amer: >60 mL/min (ref >60)

## 2010-11-30 LAB — DIFFERENTIAL
Eosinophils Absolute: 0.1 10*3/uL (ref 0.0–0.7)
Lymphs Abs: 1.8 10*3/uL (ref 0.7–4.0)
Monocytes Relative: 9 % (ref 3–12)
Neutro Abs: 2.2 10*3/uL (ref 1.7–7.7)
Neutrophils Relative %: 47 % (ref 43–77)

## 2010-11-30 LAB — CBC
Hemoglobin: 13.7 g/dL (ref 13.0–17.0)
MCH: 31.9 pg (ref 26.0–34.0)
RBC: 4.3 MIL/uL (ref 4.22–5.81)

## 2011-01-18 ENCOUNTER — Other Ambulatory Visit: Payer: Self-pay | Admitting: Internal Medicine

## 2011-01-21 NOTE — Telephone Encounter (Signed)
272.4/995.20 Lipid/Hep  

## 2011-02-01 NOTE — Assessment & Plan Note (Signed)
[facility]                        [facility] OFFICE NOTE   Jason, Bowman                      MRN:          [id number]  DATE:01/07/2007                            DOB:          1956-08-14    Jason Bowman was seen January 07, 2007 for a followup of his lipids and  A1c.  His lipids are essentially at goal; a goal sheet was provided and  reviewed.  His A1c is upper limits of normal at 6.0.   His father had diabetes, as did members of his paternal family.   He has been following Dr. Darnelle Maffucci book for low cholesterol.  He has  been avoiding high-fructose corn syrup; he does have pasta in his diet.  He exercises only once a week.   He notes no cardiopulmonary symptoms.  Additionally, he has no non-  healing skin lesions or paresthesias.  He denies polyuria, polyphagia,  or polydipsia.   MEDICATIONS:  1. He is presently on Vytorin 10/20 and had some questions about the      recent reports.  2. He has been switched from Allegra to Zyrtec for insurance reasons.  3. He is followed by Dr. Suzanna Obey and  is on a topical steroid as      needed.  4. He also continues a baby aspirin.   Weight is up approximately 2 pounds to 207 fully clothed.  Pulse was 60.  Respiratory rate was 16.  Blood pressure 102/70.  Thyroid was normal to palpation.  He has no carotid bruits.  No murmurs or gallops are noted.  All pulses are intact.  Cardiopulmonary exam is negative.  He has no organomegaly or masses.  There is no hepatomegaly or abdominal  tenderness.Skin was lesion free with no ischemic change.   The labs did reveal mild elevation of the SGOT and SGPT.  It would be  recommended that he avoid excess ingestion of alcohol (he drinks  rarely), Tylenol or vitamin A.   A carbohydrate restriction was encouraged, such as the Flat Belly Diet.  The fasting labs will be repeated in August and adjustments made in his  medications as needed.   He is very  motivated and the therapeutic lifestyle interventions and the  medications have reduced his risk by half in reference to his  dyslipidemia.  This was discussed with Jason Bowman.He requested to continue  the Vytorin after the study results were discussed in context of his  specific risks pattern.     Titus Dubin. Alwyn Ren, MD,FACP,FCCP  Electronically Signed   [facility]  DD: 01/07/2007  DT: 01/07/2007  Job #: [id number]

## 2011-02-22 ENCOUNTER — Other Ambulatory Visit: Payer: Self-pay | Admitting: Internal Medicine

## 2011-02-22 MED ORDER — EZETIMIBE-SIMVASTATIN 10-20 MG PO TABS: 1.0000 | ORAL_TABLET | Freq: Every day | ORAL | Status: DC

## 2011-02-22 NOTE — Telephone Encounter (Signed)
Lipid/Hep 272.4/995.20 due

## 2011-04-10 ENCOUNTER — Other Ambulatory Visit: Payer: Self-pay | Admitting: Internal Medicine

## 2011-04-10 MED ORDER — EZETIMIBE-SIMVASTATIN 10-20 MG PO TABS: 1.0000 | ORAL_TABLET | Freq: Every day | ORAL | Status: DC

## 2011-04-10 NOTE — Telephone Encounter (Signed)
RX sent to pharmacy, letter to be mailed informing patient labs due Lipid/Hep 272.4/995.20

## 2011-05-13 ENCOUNTER — Other Ambulatory Visit: Payer: Self-pay | Admitting: Internal Medicine

## 2011-05-13 NOTE — Telephone Encounter (Signed)
Lipid/Hep 272.4/995.20  

## 2011-05-27 ENCOUNTER — Ambulatory Visit (INDEPENDENT_AMBULATORY_CARE_PROVIDER_SITE_OTHER): Payer: BC Managed Care – PPO | Admitting: Internal Medicine

## 2011-05-27 ENCOUNTER — Encounter: Payer: Self-pay | Admitting: Internal Medicine

## 2011-05-27 VITALS — BP 114/80 | HR 73 | Temp 98.6°F | Wt 201.8 lb

## 2011-05-27 DIAGNOSIS — E785 Hyperlipidemia, unspecified: Secondary | ICD-10-CM

## 2011-05-27 DIAGNOSIS — L03019 Cellulitis of unspecified finger: Secondary | ICD-10-CM

## 2011-05-27 DIAGNOSIS — T887XXA Unspecified adverse effect of drug or medicament, initial encounter: Secondary | ICD-10-CM

## 2011-05-27 LAB — BASIC METABOLIC PANEL
BUN: 15 mg/dL (ref 6–23)
CO2: 27 mEq/L (ref 19–32)
Calcium: 9.2 mg/dL (ref 8.4–10.5)
Creatinine, Ser: 1 mg/dL (ref 0.4–1.5)
GFR: 101.53 mL/min (ref >60.00)
Glucose, Bld: 89 mg/dL (ref 70–99)

## 2011-05-27 LAB — LIPID PANEL
Cholesterol: 134 mg/dL (ref 0–200)
HDL: 51.8 mg/dL (ref >39.00)
LDL Cholesterol: 67 mg/dL (ref 0–99)
Triglycerides: 75 mg/dL (ref 0.0–149.0)
VLDL: 15 mg/dL (ref 0.0–40.0)

## 2011-05-27 LAB — HEPATIC FUNCTION PANEL: Total Protein: 7.5 g/dL (ref 6.0–8.3)

## 2011-05-27 MED ORDER — CEPHALEXIN 500 MG PO CAPS: 500.0000 mg | ORAL_CAPSULE | Freq: Two times a day (BID) | ORAL | Status: AC

## 2011-05-27 NOTE — Patient Instructions (Signed)
Soak finger twice a day in saline. The saline can be purchased at the drugstore or you can make your own .Boil cup of salt in a gallon of water. Store mixture  in a clean container.Report Warning  signs as discussed (red streaks, pus, fever, increasing pain).  Nizoral applied topically and blown  dry with hair dryer after saline soaks twice a day.  

## 2011-05-27 NOTE — Progress Notes (Signed)
  Subjective:    Patient ID: Jason Bowman, male    DOB: Feb 20, 1956, 55 y.o.   MRN: [id number]  HPI Finger Infection: Location:R index nail Onset:05/19/2011 Trigger/injury:no, except for hang nail he clipped Pain quality:numbing pain Pain severity:up  To 5 Duration:intermittently, now only with pressure Radiation:no Treatment/response:mercurochrome Review of systems: Constitutional: no fever, chills, sweats Heme:no lymphadenopathy    Review of Systems no purulence    Objective:   Physical Exam  He appears healthy in no acute distress  He has no cervical, axillary, epitrochlear lymphadenopathy.  The radial artery pulses are intact.  He has no clubbing, cyanosis or edema.  There is a slight paronychia noted over the lateral right index nail without change in temperature or color. Slight edema is noted locally with tenderness to palpation        Assessment & Plan:    #1 paronychia, resolving  Plan: See orders and recommendations.

## 2011-05-27 NOTE — Progress Notes (Signed)
Addended by: Edgardo Roys on: 05/27/2011 10:04 AM   Modules accepted: Orders

## 2011-06-18 ENCOUNTER — Other Ambulatory Visit: Payer: Self-pay | Admitting: Internal Medicine

## 2012-05-29 ENCOUNTER — Other Ambulatory Visit: Payer: BC Managed Care – PPO

## 2012-07-26 ENCOUNTER — Other Ambulatory Visit: Payer: Self-pay | Admitting: Internal Medicine

## 2012-07-29 ENCOUNTER — Other Ambulatory Visit (INDEPENDENT_AMBULATORY_CARE_PROVIDER_SITE_OTHER): Payer: BC Managed Care – PPO

## 2012-07-29 DIAGNOSIS — Z0000 Encounter for general adult medical examination without abnormal findings: Secondary | ICD-10-CM

## 2012-07-29 DIAGNOSIS — E785 Hyperlipidemia, unspecified: Secondary | ICD-10-CM

## 2012-07-29 DIAGNOSIS — R7309 Other abnormal glucose: Secondary | ICD-10-CM

## 2012-07-29 DIAGNOSIS — T887XXA Unspecified adverse effect of drug or medicament, initial encounter: Secondary | ICD-10-CM

## 2012-07-29 LAB — HEPATIC FUNCTION PANEL
AST: 37 U/L (ref 0–37)
Alkaline Phosphatase: 35 U/L — ABNORMAL LOW (ref 39–117)
Bilirubin, Direct: 0.1 mg/dL (ref 0.0–0.3)
Total Bilirubin: 0.4 mg/dL (ref 0.3–1.2)

## 2012-07-29 LAB — BASIC METABOLIC PANEL
BUN: 13 mg/dL (ref 6–23)
CO2: 27 mEq/L (ref 19–32)
Calcium: 9 mg/dL (ref 8.4–10.5)
Glucose, Bld: 94 mg/dL (ref 70–99)
Sodium: 137 mEq/L (ref 135–145)

## 2012-07-29 LAB — CBC WITH DIFFERENTIAL/PLATELET
Basophils Relative: 1.1 % (ref 0.0–3.0)
Eosinophils Absolute: 0.1 10*3/uL (ref 0.0–0.7)
Hemoglobin: 13.8 g/dL (ref 13.0–17.0)
MCHC: 33.3 g/dL (ref 30.0–36.0)
MCV: 92.3 fl (ref 78.0–100.0)
Monocytes Absolute: 0.3 10*3/uL (ref 0.1–1.0)
Neutro Abs: 1.8 10*3/uL (ref 1.4–7.7)
Neutrophils Relative %: 48.5 % (ref 43.0–77.0)
RBC: 4.5 Mil/uL (ref 4.22–5.81)
RDW: 13.1 % (ref 11.5–14.6)

## 2012-07-29 LAB — LIPID PANEL
HDL: 47.6 mg/dL (ref >39.00)
Total CHOL/HDL Ratio: 4

## 2012-08-05 ENCOUNTER — Encounter: Payer: Self-pay | Admitting: Internal Medicine

## 2012-08-05 ENCOUNTER — Ambulatory Visit (INDEPENDENT_AMBULATORY_CARE_PROVIDER_SITE_OTHER): Payer: BC Managed Care – PPO | Admitting: Internal Medicine

## 2012-08-05 VITALS — BP 106/64 | HR 78 | Temp 98.2°F | Resp 12 | Ht 69.03 in | Wt 203.8 lb

## 2012-08-05 DIAGNOSIS — E785 Hyperlipidemia, unspecified: Secondary | ICD-10-CM

## 2012-08-05 DIAGNOSIS — R350 Frequency of micturition: Secondary | ICD-10-CM

## 2012-08-05 DIAGNOSIS — Z0000 Encounter for general adult medical examination without abnormal findings: Secondary | ICD-10-CM

## 2012-08-05 LAB — POCT URINALYSIS DIPSTICK
Blood, UA: NEGATIVE
Protein, UA: NEGATIVE
Spec Grav, UA: 1.015
Urobilinogen, UA: 0.2

## 2012-08-05 MED ORDER — EZETIMIBE-SIMVASTATIN 10-20 MG PO TABS: 1.0000 | ORAL_TABLET | Freq: Every day | ORAL | Status: AC

## 2012-08-05 NOTE — Patient Instructions (Addendum)
Preventive Health Care: Exercise at least 30-45 minutes a day,  3-4 days a week.  Eat a low-fat diet with lots of fruits and vegetables, up to 7-9 servings per day.  Consume less than 40 grams of sugar per day from foods & drinks with High Fructose Corn Sugar as #1,2,3 or # 4 on label. Eye Doctor - have an eye exam @ least annually.                                                         Health Care Power of Attorney & Living Will. Complete if not in place ; these place you in charge of your health care decisions.  There is minimal reduction in Alkaline Phosphatase.Dietary sources of  Alk Phos include whole grains & nuts. Alk Phos is important for optimal liver & bone health.  Minimally reduced white blood count is not significant as the differential or types of cells are normal.     If you activate My Chart; the results can be released to you as soon as they populate from the lab. If you choose not to use this program; the labs have to be reviewed, copied & mailed   causing a delay in getting the results to you.

## 2012-08-05 NOTE — Progress Notes (Signed)
  Subjective:    Patient ID: Jason Bowman, male    DOB: 22-Jan-1956, 56 y.o.   MRN: [id number]  HPI  Jason Bowman is here for a physical;acute issues are reviewed below.      Review of Systems He has urinary frequency 4-5 times per day. Nocturia occurs once nightly; but he does not drink fluids after the evening meal. He denies excessive thirst or hunger. He has no unexplained weight loss. His father did have diabetes.  He has no trouble starting or stopping the stream. He denies dysuria, hematuria, or pyuria.     Objective:   Physical Exam Gen.: Healthy and well-nourished in appearance. Alert, appropriate and cooperative throughout exam. Head: Normocephalic without obvious abnormalities;  Beard & moustache  Eyes: No corneal or conjunctival inflammation noted. Pupils equal round reactive to light and accommodation. Fundal exam is benign without hemorrhages, exudate, papilledema. Extraocular motion intact. Vision grossly normal with lenses. Ears: External  ear exam reveals no significant lesions or deformities. Canals clear .TMs normal. Hearing is grossly normal bilaterally. Nose: External nasal exam reveals no deformity or inflammation. Nasal mucosa are pink and moist. No lesions or exudates noted. R nare smaller  Mouth: Oral mucosa and oropharynx reveal no lesions or exudates. Teeth in good repair. Neck: No deformities, masses, or tenderness noted. Range of motion  decreased. Thyroid normal. Lungs: Normal respiratory effort; chest expands symmetrically. Lungs are clear to auscultation without rales, wheezes, or increased work of breathing. Heart: Normal rate and rhythm. Normal S1 and S2. No gallop, click, or rub. S4 w/o murmur. Abdomen: Bowel sounds normal; abdomen soft and nontender. No masses, organomegaly or hernias noted. Genitalia/ DRE: Genitalia normal except for left varices. Prostate is normal without enlargement, asymmetry, nodularity, or induration.                                                        Musculoskeletal/extremities: No deformity or scoliosis noted of  the thoracic or lumbar spine. No clubbing, cyanosis, edema, or deformity noted. Range of motion  normal .Tone & strength  normal.Joints normal. Nail health  good. Vascular: Carotid, radial artery, dorsalis pedis and  posterior tibial pulses are full and equal. No bruits present. Neurologic: Alert and oriented x3. Deep tendon reflexes symmetrical and normal.          Skin: Intact without suspicious lesions or rashes.9X16 mm polyp buttock Lymph: No cervical, axillary, or inguinal lymphadenopathy present. Psych: Mood and affect are normal. Normally interactive                                                                                        Assessment & Plan:  #1 comprehensive physical exam; no acute findings #2 frequency of urination Plan: see Orders

## 2012-09-16 HISTORY — PX: DIRECT LARYNGOSCOPY: SHX5326

## 2012-09-23 ENCOUNTER — Other Ambulatory Visit: Payer: Self-pay | Admitting: Internal Medicine

## 2013-02-12 ENCOUNTER — Telehealth: Payer: Self-pay | Admitting: Internal Medicine

## 2013-02-12 DIAGNOSIS — T887XXA Unspecified adverse effect of drug or medicament, initial encounter: Secondary | ICD-10-CM

## 2013-02-12 DIAGNOSIS — E785 Hyperlipidemia, unspecified: Secondary | ICD-10-CM

## 2013-02-12 DIAGNOSIS — Z0000 Encounter for general adult medical examination without abnormal findings: Secondary | ICD-10-CM

## 2013-02-12 NOTE — Telephone Encounter (Signed)
Orders placed.

## 2013-02-12 NOTE — Telephone Encounter (Signed)
Patient would like to have labs done prior to his 08/06/13 CPE. Pt has [employer]. Can you place orders please? He will be coming to [facility].

## 2013-05-07 ENCOUNTER — Encounter: Payer: Self-pay | Admitting: Gastroenterology

## 2013-06-01 ENCOUNTER — Encounter: Payer: Self-pay | Admitting: Gastroenterology

## 2013-06-30 ENCOUNTER — Ambulatory Visit (INDEPENDENT_AMBULATORY_CARE_PROVIDER_SITE_OTHER): Payer: BC Managed Care – PPO | Admitting: Physician Assistant

## 2013-06-30 ENCOUNTER — Encounter: Payer: Self-pay | Admitting: Physician Assistant

## 2013-06-30 VITALS — BP 110/76 | HR 75 | Temp 98.5°F | Resp 16 | Ht 68.0 in | Wt 202.8 lb

## 2013-06-30 DIAGNOSIS — J029 Acute pharyngitis, unspecified: Secondary | ICD-10-CM

## 2013-06-30 NOTE — Assessment & Plan Note (Signed)
Peroxyl mouthwash gargles.  Avoid rough or jagged foods.  Increase fluid intake.  Saline nasal spray. Daily claritin for allergies.  F/U with ENT specialist if symptoms are not improving in the next few days.

## 2013-06-30 NOTE — Progress Notes (Signed)
Patient ID: Jason Bowman, male   DOB: 01-Dec-1955, 57 y.o.   MRN: [id number]  Patient presents to clinic today c/o "throat feels scratchy when I swallow" x 1 week.  Patient states that he noticed this sensation after a fish bone got stuck in his throat.  Patient states the scratchiness is solely left sided, and is mostly present when swallowing.  Patient has had both tonsils and uvula removed in the past.  Patient denies fever, chills, swollen sensation of neck.  Patient has been able to swallow food successfully.  Denies muffled voice or drooling.  Has tried to gargle a couple of times with Listerine but states it is not helping.  Has history of mild seasonal allergies.   Past Medical History  Diagnosis Date  . Hyperlipidemia   . Allergy     perennial    Current Outpatient Prescriptions on File Prior to Visit  Medication Sig Dispense Refill  . aspirin 81 MG tablet Take 81 mg by mouth daily.        . budesonide (RHINOCORT AQUA) 32 MCG/ACT nasal spray Place 1 spray into the nose daily.       Marland Kitchen ezetimibe-simvastatin (VYTORIN) 10-20 MG per tablet Take 1 tablet by mouth at bedtime.  30 tablet  11  . fexofenadine (ALLEGRA) 180 MG tablet Take 180 mg by mouth daily.        . Multiple Vitamin (MULTIVITAMIN) tablet Take 1 tablet by mouth daily.         No current facility-administered medications on file prior to visit.    Allergies  Allergen Reactions  . Celecoxib     He is unsure  . Rosuvastatin     He is unsure    Family History  Problem Relation Age of Onset  . Heart disease Father     pacer  . Diabetes Father   . Hypertension Father   . Cancer Neg Hx   . Stroke Neg Hx     History   Social History  . Marital Status: Married    Spouse Name: N/A    Number of Children: N/A  . Years of Education: N/A   Social History Main Topics  . Smoking status: Never Smoker   . Smokeless tobacco: None  . Alcohol Use: Yes     Comment: Social, 2 x yearly   . Drug Use: No  . Sexual  Activity: None   Other Topics Concern  . None   Social History Narrative  . None   ROS See HPI.  All other ROS are negative   Filed Vitals:   06/30/13 1710  BP: 110/76  Pulse: 75  Temp: 98.5 F (36.9 C)  Resp: 16   Physical Exam  Vitals reviewed. Constitutional: He is well-developed, well-nourished, and in no distress.  HENT:  Head: Normocephalic and atraumatic.  Right Ear: External ear normal.  Left Ear: External ear normal.  Nose: Nose normal.  Mouth/Throat: Oropharynx is clear and moist. No oropharyngeal exudate.  No posterior oropharyngeal erythema noted on exam.  No evidence of abscess noted.  Eyes: Conjunctivae are normal.  Neck: Neck supple.  Cardiovascular: Normal rate, regular rhythm and normal heart sounds.   Pulmonary/Chest: Effort normal and breath sounds normal.  Lymphadenopathy:    He has no cervical adenopathy.  Skin: Skin is warm and dry.   Assessment/Plan: Throat soreness Peroxyl mouthwash gargles.  Avoid rough or jagged foods.  Increase fluid intake.  Saline nasal spray. Daily claritin for allergies.  F/U with  ENT specialist if symptoms are not improving in the next few days.

## 2013-06-30 NOTE — Patient Instructions (Signed)
Drink plenty of fluids.  Continue allergy medications.  Peroxyl mouth wash to gargle with (do not swallow).  If symptoms are not improving, please see your ENT specialist.

## 2013-08-02 ENCOUNTER — Other Ambulatory Visit (INDEPENDENT_AMBULATORY_CARE_PROVIDER_SITE_OTHER): Payer: BC Managed Care – PPO

## 2013-08-02 DIAGNOSIS — Z0000 Encounter for general adult medical examination without abnormal findings: Secondary | ICD-10-CM

## 2013-08-02 DIAGNOSIS — E785 Hyperlipidemia, unspecified: Secondary | ICD-10-CM

## 2013-08-02 DIAGNOSIS — T887XXA Unspecified adverse effect of drug or medicament, initial encounter: Secondary | ICD-10-CM

## 2013-08-02 LAB — CBC WITH DIFFERENTIAL/PLATELET
Basophils Relative: 0.8 % (ref 0.0–3.0)
Eosinophils Absolute: 0.1 10*3/uL (ref 0.0–0.7)
MCHC: 33.9 g/dL (ref 30.0–36.0)
MCV: 90.2 fl (ref 78.0–100.0)
Monocytes Absolute: 0.2 10*3/uL (ref 0.1–1.0)
Neutrophils Relative %: 54 % (ref 43.0–77.0)
Platelets: 154 10*3/uL (ref 150.0–400.0)
RBC: 4.41 Mil/uL (ref 4.22–5.81)

## 2013-08-02 LAB — HEPATIC FUNCTION PANEL
Alkaline Phosphatase: 37 U/L — ABNORMAL LOW (ref 39–117)
Bilirubin, Direct: 0 mg/dL (ref 0.0–0.3)
Total Bilirubin: 0.4 mg/dL (ref 0.3–1.2)

## 2013-08-02 LAB — BASIC METABOLIC PANEL
BUN: 15 mg/dL (ref 6–23)
CO2: 27 mEq/L (ref 19–32)
Chloride: 104 mEq/L (ref 96–112)
Creatinine, Ser: 1.1 mg/dL (ref 0.4–1.5)
Glucose, Bld: 103 mg/dL — ABNORMAL HIGH (ref 70–99)

## 2013-08-02 LAB — LIPID PANEL
LDL Cholesterol: 101 mg/dL — ABNORMAL HIGH (ref 0–99)
Total CHOL/HDL Ratio: 3

## 2013-08-05 ENCOUNTER — Telehealth: Payer: Self-pay

## 2013-08-05 ENCOUNTER — Ambulatory Visit (AMBULATORY_SURGERY_CENTER): Payer: Self-pay | Admitting: *Deleted

## 2013-08-05 VITALS — Ht 67.0 in | Wt 202.0 lb

## 2013-08-05 DIAGNOSIS — Z86010 Personal history of colonic polyps: Secondary | ICD-10-CM

## 2013-08-05 MED ORDER — NA SULFATE-K SULFATE-MG SULF 17.5-3.13-1.6 GM/177ML PO SOLN: 1.0000 | Freq: Once | ORAL | Status: DC

## 2013-08-05 NOTE — Progress Notes (Signed)
No allergies to eggs or soy. No problems with anesthesia.  

## 2013-08-05 NOTE — Telephone Encounter (Addendum)
Medication List and allergies: reviewed and updated  90 day supply/mail order: na Local prescriptions: [facility] [address]  Immunizations due: UTD  A/P:   No changes to FH or PSH Flu vaccine--05/2013 Tdap--02/2006 CCS--08/27/2013 scheduled PSA--05/2008--0.30  To Discuss with Provider: Mom passed in July :( Not at this time

## 2013-08-06 ENCOUNTER — Ambulatory Visit (INDEPENDENT_AMBULATORY_CARE_PROVIDER_SITE_OTHER): Payer: BC Managed Care – PPO | Admitting: Internal Medicine

## 2013-08-06 ENCOUNTER — Encounter: Payer: Self-pay | Admitting: Internal Medicine

## 2013-08-06 VITALS — BP 111/70 | HR 86 | Temp 97.1°F | Ht 69.25 in | Wt 204.0 lb

## 2013-08-06 DIAGNOSIS — IMO0002 Reserved for concepts with insufficient information to code with codable children: Secondary | ICD-10-CM

## 2013-08-06 DIAGNOSIS — E785 Hyperlipidemia, unspecified: Secondary | ICD-10-CM

## 2013-08-06 DIAGNOSIS — J309 Allergic rhinitis, unspecified: Secondary | ICD-10-CM

## 2013-08-06 DIAGNOSIS — J387 Other diseases of larynx: Secondary | ICD-10-CM

## 2013-08-06 DIAGNOSIS — K219 Gastro-esophageal reflux disease without esophagitis: Secondary | ICD-10-CM | POA: Insufficient documentation

## 2013-08-06 NOTE — Progress Notes (Signed)
Pre visit review using our clinic review tool, if applicable. No additional management support is needed unless otherwise documented below in the visit note. 

## 2013-08-06 NOTE — Patient Instructions (Signed)
Your next office appointment will be determined based upon review of your pending labs . Those instructions will be transmitted to you through My Chart  or by mail if you're not using this system.   

## 2013-08-06 NOTE — Progress Notes (Signed)
  Subjective:    Patient ID: Jason Bowman, male    DOB: [birth year], 57 y.o.   MRN: [id number]  HPI  He is here for a physical;acute issues include LRD treated with Nexium by Dr Jearld Fenton, ENT     Review of Systems He denies melena, rectal bleeding, unexplained weight loss, dysphagia, or abdominal pain. Colonoscopy pending 08/27/13.  A modified heart healthy diet is followed; no regular exercise .Family history is negative for premature coronary disease. Advanced cholesterol testing reveals  LDL goal is less than 100. There is medication compliance with the statin.  Low dose ASA taken Specifically denied are  chest pain, palpitations, dyspnea, or claudication.  Significant  memory deficit or myalgias not present.    Objective:   Physical Exam Gen.: Healthy and well-nourished in appearance. Alert, appropriate and cooperative throughout exam.  Head: Normocephalic without obvious abnormalities; beard & moustache Eyes: No corneal or conjunctival inflammation noted. Pupils equal round reactive to light and accommodation. Extraocular motion intact.  Ears: External  ear exam reveals no significant lesions or deformities. Canals clear .TMs normal.  Nose: External nasal exam reveals no deformity or inflammation. Nasal mucosa are pink and moist. No lesions or exudates noted.  Mouth: Oral mucosa and oropharynx reveal no lesions or exudates. Teeth in good repair. Neck: No deformities, masses, or tenderness noted. Range of motion & Thyroid normal. Lungs: Normal respiratory effort; chest expands symmetrically. Lungs are clear to auscultation without rales, wheezes, or increased work of breathing. Heart: Normal rate and rhythm. Normal S1 and S2. No gallop, click, or rub. S4 w/o murmur. Abdomen: Bowel sounds normal; abdomen soft and nontender. No masses, organomegaly or hernias noted. Genitalia: Genitalia normal except for left varices. Prostate exam deferred ; colonoscopy 12/12. PSA always low.   Musculoskeletal/extremities: No deformity or scoliosis noted of  the thoracic or lumbar spine.  No clubbing, cyanosis, edema, or significant extremity  deformity noted. Range of motion normal .Tone & strength normal. Hand joints normal . Fingernail  health good. Able to lie down & sit up w/o help. Negative SLR bilaterally Vascular: Carotid, radial artery, dorsalis pedis and  posterior tibial pulses are full and equal. No bruits present. Neurologic: Alert and oriented x3. Deep tendon reflexes symmetrical and normal.         Skin: Intact without suspicious lesions or rashes.Large intergluteal verrucal lesion Lymph: No cervical, axillary, or inguinal lymphadenopathy present. Psych: Mood and affect are normal. Normally interactive                                                                                        Assessment & Plan:  #1 comprehensive physical exam; no acute findings  Plan: see Orders  & Recommendations

## 2013-08-09 ENCOUNTER — Encounter: Payer: Self-pay | Admitting: Gastroenterology

## 2013-08-27 ENCOUNTER — Ambulatory Visit (AMBULATORY_SURGERY_CENTER): Payer: BC Managed Care – PPO | Admitting: Gastroenterology

## 2013-08-27 ENCOUNTER — Encounter: Payer: Self-pay | Admitting: Gastroenterology

## 2013-08-27 VITALS — BP 143/85 | HR 73 | Temp 97.0°F | Resp 12 | Ht 67.0 in | Wt 202.0 lb

## 2013-08-27 DIAGNOSIS — D126 Benign neoplasm of colon, unspecified: Secondary | ICD-10-CM

## 2013-08-27 DIAGNOSIS — Z86010 Personal history of colonic polyps: Secondary | ICD-10-CM

## 2013-08-27 MED ORDER — SODIUM CHLORIDE 0.9 % IV SOLN: 500.0000 mL | INTRAVENOUS | Status: DC

## 2013-08-27 NOTE — Progress Notes (Signed)
Called to room to assist during endoscopic procedure.  Patient ID and intended procedure confirmed with present staff. Received instructions for my participation in the procedure from the performing physician.  

## 2013-08-27 NOTE — Progress Notes (Signed)
Patient did not experience any of the following events: a burn prior to discharge; a fall within the facility; wrong site/side/patient/procedure/implant event; or a hospital transfer or hospital admission upon discharge from the facility. (G8907) Patient did not have preoperative order for IV antibiotic SSI prophylaxis. (G8918)  

## 2013-08-27 NOTE — Op Note (Signed)
[facility] [address] [place], [address]   COLONOSCOPY PROCEDURE REPORT  PATIENT: Jason Bowman, Jason Bowman  MR#: [id number] BIRTHDATE: [birth year] , 57  yrs. old GENDER: Male ENDOSCOPIST: Meryl Dare, MD, [facility] PROCEDURE DATE:  08/27/2013 PROCEDURE:   Colonoscopy with snare polypectomy First Screening Colonoscopy - Avg.  risk and is 50 yrs.  old or older - No.  Prior Negative Screening - Now for repeat screening. N/A  History of Adenoma - Now for follow-up colonoscopy & has been > or = to 3 yrs.  Yes hx of adenoma.  Has been 3 or more years since last colonoscopy.  Polyps Removed Today? Yes. ASA CLASS:   Class II INDICATIONS:Patient's personal history of adenomatous colon polyps.  MEDICATIONS: MAC sedation, administered by CRNA and propofol (Diprivan) 300mg  IV DESCRIPTION OF PROCEDURE:   After the risks benefits and alternatives of the procedure were thoroughly explained, informed consent was obtained.  A digital rectal exam revealed no abnormalities of the rectum.   The     endoscope was introduced through the anus and advanced to the cecum, which was identified by both the appendix and ileocecal valve. No adverse events experienced.   The quality of the prep was excellent, using MoviPrep  The instrument was then slowly withdrawn as the colon was fully examined.  COLON FINDINGS: A semi-pedunculated polyp measuring 7 mm in size was found in the ascending colon.  A polypectomy was performed with a cold snare.  The resection was complete and the polyp tissue was completely retrieved.   The colon was otherwise normal.  There was no diverticulosis, inflammation, polyps or cancers unless previously stated.  Retroflexed views revealed no abnormalities. The time to cecum=3 minutes 39 seconds.  Withdrawal time=11 minutes 19 seconds.  The scope was withdrawn and the procedure completed. COMPLICATIONS: There were no complications.  ENDOSCOPIC IMPRESSION: 1.    Semi-pedunculated polyp measuring 7 mm in the ascending colon; polypectomy performed with a cold snare 2.   The colon was otherwise normal  RECOMMENDATIONS: 1.  Await pathology results 2.  Repeat Colonoscopy in 5 years.  eSigned:  Meryl Dare, MD, [facility] 08/27/2013 2:01 PM

## 2013-08-27 NOTE — Patient Instructions (Signed)
1 polyp removed and sent to pathology  Repeat colonoscopy in 5 years   YOU HAD AN ENDOSCOPIC PROCEDURE TODAY AT THE [facility]: Refer to the procedure report that was given to you for any specific questions about what was found during the examination.  If the procedure report does not answer your questions, please call your gastroenterologist to clarify.  If you requested that your care partner not be given the details of your procedure findings, then the procedure report has been included in a sealed envelope for you to review at your convenience later.  YOU SHOULD EXPECT: Some feelings of bloating in the abdomen. Passage of more gas than usual.  Walking can help get rid of the air that was put into your GI tract during the procedure and reduce the bloating. If you had a lower endoscopy (such as a colonoscopy or flexible sigmoidoscopy) you may notice spotting of blood in your stool or on the toilet paper. If you underwent a bowel prep for your procedure, then you may not have a normal bowel movement for a few days.  DIET: Your first meal following the procedure should be a light meal and then it is ok to progress to your normal diet.  A half-sandwich or bowl of soup is an example of a good first meal.  Heavy or fried foods are harder to digest and may make you feel nauseous or bloated.  Likewise meals heavy in dairy and vegetables can cause extra gas to form and this can also increase the bloating.  Drink plenty of fluids but you should avoid alcoholic beverages for 24 hours.  ACTIVITY: Your care partner should take you home directly after the procedure.  You should plan to take it easy, moving slowly for the rest of the day.  You can resume normal activity the day after the procedure however you should NOT DRIVE or use heavy machinery for 24 hours (because of the sedation medicines used during the test).    SYMPTOMS TO REPORT IMMEDIATELY: A gastroenterologist can be reached at any hour.   During normal business hours, 8:30 AM to 5:00 PM Monday through Friday, call [phone].  After hours and on weekends, please call the GI answering service at [phone] who will take a message and have the physician on call contact you.   Following lower endoscopy (colonoscopy or flexible sigmoidoscopy):  Excessive amounts of blood in the stool  Significant tenderness or worsening of abdominal pains  Swelling of the abdomen that is new, acute  Fever of 100F or higher   FOLLOW UP: If any biopsies were taken you will be contacted by phone or by letter within the next 1-3 weeks.  Call your gastroenterologist if you have not heard about the biopsies in 3 weeks.  Our staff will call the home number listed on your records the next business day following your procedure to check on you and address any questions or concerns that you may have at that time regarding the information given to you following your procedure. This is a courtesy call and so if there is no answer at the home number and we have not heard from you through the emergency physician on call, we will assume that you have returned to your regular daily activities without incident.  SIGNATURES/CONFIDENTIALITY: You and/or your care partner have signed paperwork which will be entered into your electronic medical record.  These signatures attest to the fact that that the information above on your After  Visit Summary has been reviewed and is understood.  Full responsibility of the confidentiality of this discharge information lies with you and/or your care-partner. 

## 2013-08-27 NOTE — Progress Notes (Signed)
Stable to RR 

## 2013-08-30 ENCOUNTER — Telehealth: Payer: Self-pay | Admitting: *Deleted

## 2013-08-30 NOTE — Telephone Encounter (Signed)
  Follow up Call-  Call back number 08/27/2013  Post procedure Call Back phone  # [phone]  Permission to leave phone message Yes     Patient questions:  Do you have a fever, pain , or abdominal swelling? no Pain Score  0 *  Have you tolerated food without any problems? yes  Have you been able to return to your normal activities? yes  Do you have any questions about your discharge instructions: Diet   no Medications  no Follow up visit  no  Do you have questions or concerns about your Care? no  Actions: * If pain score is 4 or above: No action needed, pain <4.

## 2013-08-31 ENCOUNTER — Encounter: Payer: Self-pay | Admitting: Gastroenterology

## 2013-10-22 ENCOUNTER — Other Ambulatory Visit: Payer: Self-pay | Admitting: Otolaryngology

## 2013-11-03 ENCOUNTER — Other Ambulatory Visit: Payer: Self-pay | Admitting: Internal Medicine

## 2014-08-08 ENCOUNTER — Ambulatory Visit (INDEPENDENT_AMBULATORY_CARE_PROVIDER_SITE_OTHER): Payer: BC Managed Care – PPO | Admitting: Internal Medicine

## 2014-08-08 ENCOUNTER — Other Ambulatory Visit (INDEPENDENT_AMBULATORY_CARE_PROVIDER_SITE_OTHER): Payer: BC Managed Care – PPO

## 2014-08-08 ENCOUNTER — Encounter: Payer: Self-pay | Admitting: Internal Medicine

## 2014-08-08 ENCOUNTER — Other Ambulatory Visit: Payer: Self-pay | Admitting: Internal Medicine

## 2014-08-08 VITALS — BP 130/80 | HR 73 | Temp 98.4°F | Resp 12 | Wt 199.8 lb

## 2014-08-08 DIAGNOSIS — Z0189 Encounter for other specified special examinations: Secondary | ICD-10-CM

## 2014-08-08 DIAGNOSIS — Z0000 Encounter for general adult medical examination without abnormal findings: Secondary | ICD-10-CM

## 2014-08-08 DIAGNOSIS — R7301 Impaired fasting glucose: Secondary | ICD-10-CM

## 2014-08-08 DIAGNOSIS — E785 Hyperlipidemia, unspecified: Secondary | ICD-10-CM

## 2014-08-08 LAB — CBC WITH DIFFERENTIAL/PLATELET
BASOS ABS: 0 10*3/uL (ref 0.0–0.1)
Basophils Relative: 0.3 % (ref 0.0–3.0)
EOS ABS: 0.1 10*3/uL (ref 0.0–0.7)
Eosinophils Relative: 1.4 % (ref 0.0–5.0)
HEMATOCRIT: 45.3 % (ref 39.0–52.0)
HEMOGLOBIN: 15 g/dL (ref 13.0–17.0)
Lymphocytes Relative: 41.9 % (ref 12.0–46.0)
Lymphs Abs: 1.9 10*3/uL (ref 0.7–4.0)
MCHC: 33 g/dL (ref 30.0–36.0)
MCV: 92 fl (ref 78.0–100.0)
MONO ABS: 0.2 10*3/uL (ref 0.1–1.0)
Monocytes Relative: 5.3 % (ref 3.0–12.0)
NEUTROS ABS: 2.3 10*3/uL (ref 1.4–7.7)
Neutrophils Relative %: 51.1 % (ref 43.0–77.0)
Platelets: 168 10*3/uL (ref 150.0–400.0)
RBC: 4.93 Mil/uL (ref 4.22–5.81)
RDW: 13.7 % (ref 11.5–15.5)
WBC: 4.5 10*3/uL (ref 4.0–10.5)

## 2014-08-08 LAB — HEPATIC FUNCTION PANEL
ALT: 25 U/L (ref 0–53)
AST: 33 U/L (ref 0–37)
Albumin: 4.6 g/dL (ref 3.5–5.2)
Alkaline Phosphatase: 45 U/L (ref 39–117)
Bilirubin, Direct: 0.1 mg/dL (ref 0.0–0.3)
Total Bilirubin: 0.8 mg/dL (ref 0.2–1.2)
Total Protein: 7.7 g/dL (ref 6.0–8.3)

## 2014-08-08 LAB — BASIC METABOLIC PANEL
BUN: 10 mg/dL (ref 6–23)
CO2: 27 meq/L (ref 19–32)
Calcium: 9.6 mg/dL (ref 8.4–10.5)
Chloride: 103 mEq/L (ref 96–112)
Creatinine, Ser: 1.1 mg/dL (ref 0.4–1.5)
GFR: 87.93 mL/min (ref >60.00)
GLUCOSE: 83 mg/dL (ref 70–99)
Potassium: 4.3 mEq/L (ref 3.5–5.1)
SODIUM: 140 meq/L (ref 135–145)

## 2014-08-08 LAB — TSH: TSH: 1.18 u[IU]/mL (ref 0.35–4.50)

## 2014-08-08 LAB — HEMOGLOBIN A1C: Hgb A1c MFr Bld: 6.2 % (ref 4.6–6.5)

## 2014-08-08 NOTE — Patient Instructions (Signed)
Your next office appointment will be determined based upon review of your pending labs . Those instructions will be transmitted to you  by mail. 

## 2014-08-08 NOTE — Progress Notes (Signed)
Pre visit review using our clinic review tool, if applicable. No additional management support is needed unless otherwise documented below in the visit note. 

## 2014-08-08 NOTE — Progress Notes (Signed)
   Subjective:    Patient ID: Jason Bowman, male    DOB: 1956-07-31, 58 y.o.   MRN: [id number]  HPI  He is here for a physical;acute issues denied.  He is on a heart  healthy diet; he exercises 3 days/week as walking or treadmill without cardiopulmonary symptoms  He's been compliant with his medications without adverse effects.  Colonoscopic monitor is up-to-date; he will need follow-up in 4 years because of history of adenomatous polyps 2.  He has no active GI symptoms. So exposure chest pain. He is in exercise nontreated breathHe  Review of Systems   Chest pain, palpitations, tachycardia, exertional dyspnea, paroxysmal nocturnal dyspnea, claudication or edema are absent.  Unexplained weight loss, abdominal pain, significant dyspepsia, dysphagia, melena, rectal bleeding, or persistently small caliber stools are denied.  No memory issues or significant myalgias.     Objective:   Physical Exam Gen.: Healthy and well-nourished in appearance. Alert, appropriate and cooperative throughout exam. Appears younger than stated age  Head: Normocephalic without obvious abnormalities; pattern alopecia . Beard & moustache. Eyes: No corneal or conjunctival inflammation noted. Pupils equal round reactive to light and accommodation. Extraocular motion intact.  Ears: External  ear exam reveals no significant lesions or deformities. Canals clear .TMs normal. Hearing is grossly normal bilaterally. Nose: External nasal exam reveals no deformity or inflammation. Nasal mucosa are pink and moist. No lesions or exudates noted.   Mouth: Oral mucosa and oropharynx reveal no lesions or exudates. Teeth in good repair. Neck: No deformities, masses, or tenderness noted. Range of motion normal. Thyroid substernal Lungs: Normal respiratory effort; chest expands symmetrically. Lungs are clear to auscultation without rales, wheezes, or increased work of breathing. Heart: Normal rate and rhythm. Normal S1 and S2. No  gallop, click, or rub. No murmur. Abdomen: Bowel sounds normal; abdomen soft and nontender. No masses, organomegaly or hernias noted. Genitalia: Genitalia normal except for left varices. Prostate is normal without enlargement, asymmetry, nodularity, or induration   Inguinal weakness bilaterally. Musculoskeletal/extremities: No deformity or scoliosis noted of  the thoracic or lumbar spine.  No clubbing, cyanosis, edema, or significant extremity  deformity noted. Range of motion normal .Tone & strength normal. Hand joints normal Fingernail  health good. Able to lie down & sit up w/o help. Negative SLR bilaterally Vascular: Carotid, radial artery, dorsalis pedis and  posterior tibial pulses are equal. Pedal pulses slightly decreased.No bruits present. Neurologic: Alert and oriented x3. Deep tendon reflexes symmetrical and normal.  Gait normal .       Skin: Intact without suspicious lesions or rashes.Intergluteal polyp. Lymph: No cervical, axillary, or inguinal lymphadenopathy present. Psych: Mood and affect are normal. Normally interactive                                                                                        Assessment & Plan:  #1 comprehensive physical exam; no acute findings  Plan: see Orders  & Recommendations

## 2014-08-10 LAB — NMR LIPOPROFILE WITH LIPIDS
Cholesterol, Total: 164 mg/dL (ref 100–199)
HDL Particle Number: 38.3 umol/L (ref >=30.5)
HDL Size: 9.1 nm — ABNORMAL LOW (ref >=9.2)
HDL-C: 60 mg/dL (ref >39)
LDL (calc): 89 mg/dL (ref 0–99)
LDL Particle Number: 1141 nmol/L — ABNORMAL HIGH (ref <1000)
LDL Size: 20.3 nm (ref >=20.8)
LP-IR Score: 32 (ref <=45)
Large HDL-P: 6.9 umol/L (ref >=4.8)
Large VLDL-P: 1.5 nmol/L (ref <=2.7)
Small LDL Particle Number: 631 nmol/L — ABNORMAL HIGH (ref <=527)
TRIGLYCERIDES: 77 mg/dL (ref 0–149)
VLDL SIZE: 40.6 nm (ref <=46.6)

## 2014-08-25 ENCOUNTER — Other Ambulatory Visit: Payer: Self-pay | Admitting: Internal Medicine

## 2015-09-12 ENCOUNTER — Other Ambulatory Visit: Payer: Self-pay | Admitting: Internal Medicine

## 2015-09-15 ENCOUNTER — Other Ambulatory Visit: Payer: Self-pay | Admitting: Internal Medicine

## 2018-08-17 ENCOUNTER — Encounter: Payer: Self-pay | Admitting: Gastroenterology

## 2018-09-20 ENCOUNTER — Encounter: Payer: Self-pay | Admitting: Gastroenterology

## 2019-01-10 ENCOUNTER — Encounter: Payer: Self-pay | Admitting: Internal Medicine

## 2019-11-26 ENCOUNTER — Ambulatory Visit: Payer: BC Managed Care – PPO | Attending: Internal Medicine

## 2019-11-26 DIAGNOSIS — Z23 Encounter for immunization: Secondary | ICD-10-CM

## 2019-11-26 NOTE — Progress Notes (Signed)
   Covid-19 Vaccination Clinic  Name:  Jason Bowman    MRN: [id number] DOB: April 15, 1956  11/26/2019  [name] was observed post Covid-19 immunization for 15 minutes without incident. He was provided with Vaccine Information Sheet and instruction to access the V-Safe system.   [name] was instructed to call 911 with any severe reactions post vaccine: Marland Kitchen Difficulty breathing  . Swelling of face and throat  . A fast heartbeat  . A bad rash all over body  . Dizziness and weakness   Immunizations Administered    Name Date Dose VIS Date Route   Pfizer COVID-19 Vaccine 11/26/2019  4:12 PM 0.3 mL 08/27/2019 Intramuscular   Manufacturer: [place]   Lot: [id number]   [place]: [id number]

## 2019-12-20 ENCOUNTER — Ambulatory Visit: Payer: BC Managed Care – PPO | Attending: Internal Medicine

## 2019-12-20 ENCOUNTER — Ambulatory Visit: Payer: BC Managed Care – PPO

## 2019-12-20 DIAGNOSIS — Z23 Encounter for immunization: Secondary | ICD-10-CM

## 2019-12-20 NOTE — Progress Notes (Signed)
   Covid-19 Vaccination Clinic  Name:  Jason Bowman    MRN: [id number] DOB: 02-Oct-1955  12/20/2019  Jason Bowman was observed post Covid-19 immunization for 15 minutes without incident. He was provided with Vaccine Information Sheet and instruction to access the V-Safe system.   Jason Bowman was instructed to call 911 with any severe reactions post vaccine: Marland Kitchen Difficulty breathing  . Swelling of face and throat  . A fast heartbeat  . A bad rash all over body  . Dizziness and weakness   Immunizations Administered    Name Date Dose VIS Date Route   Pfizer COVID-19 Vaccine 12/20/2019  1:18 PM 0.3 mL 08/27/2019 Intramuscular   Manufacturer: [place]   Lot: [id number]   [place]: [id number]

## 2022-11-10 ENCOUNTER — Encounter (HOSPITAL_BASED_OUTPATIENT_CLINIC_OR_DEPARTMENT_OTHER): Payer: Self-pay | Admitting: Emergency Medicine

## 2022-11-10 DIAGNOSIS — L03115 Cellulitis of right lower limb: Secondary | ICD-10-CM | POA: Diagnosis not present

## 2022-11-10 DIAGNOSIS — R7401 Elevation of levels of liver transaminase levels: Secondary | ICD-10-CM | POA: Diagnosis not present

## 2022-11-10 DIAGNOSIS — L539 Erythematous condition, unspecified: Secondary | ICD-10-CM | POA: Insufficient documentation

## 2022-11-10 DIAGNOSIS — R2241 Localized swelling, mass and lump, right lower limb: Secondary | ICD-10-CM | POA: Insufficient documentation

## 2022-11-10 DIAGNOSIS — Z7982 Long term (current) use of aspirin: Secondary | ICD-10-CM | POA: Insufficient documentation

## 2022-11-10 NOTE — ED Triage Notes (Signed)
Pt states he thinks he got bit by a spider on his right ankle  Pt states sxs started about 2 days ago  Pt has redness and swelling noted to the outside of his ankle

## 2022-11-11 ENCOUNTER — Emergency Department (HOSPITAL_COMMUNITY)
Admission: EM | Admit: 2022-11-11 | Discharge: 2022-11-11 | Disposition: A | Discharge status: Home / Self Care | Payer: BC Managed Care – PPO | Source: Home / Self Care | Attending: Emergency Medicine | Admitting: Emergency Medicine

## 2022-11-11 ENCOUNTER — Other Ambulatory Visit: Payer: Self-pay

## 2022-11-11 ENCOUNTER — Encounter (HOSPITAL_COMMUNITY): Payer: Self-pay | Admitting: *Deleted

## 2022-11-11 ENCOUNTER — Emergency Department (HOSPITAL_BASED_OUTPATIENT_CLINIC_OR_DEPARTMENT_OTHER)
Admission: EM | Admit: 2022-11-11 | Discharge: 2022-11-11 | Discharge status: Against Medical Advice | Payer: BC Managed Care – PPO | Attending: Emergency Medicine | Admitting: Emergency Medicine

## 2022-11-11 ENCOUNTER — Emergency Department (HOSPITAL_COMMUNITY): Payer: BC Managed Care – PPO

## 2022-11-11 DIAGNOSIS — R748 Abnormal levels of other serum enzymes: Secondary | ICD-10-CM

## 2022-11-11 DIAGNOSIS — R7401 Elevation of levels of liver transaminase levels: Secondary | ICD-10-CM | POA: Insufficient documentation

## 2022-11-11 DIAGNOSIS — Z7982 Long term (current) use of aspirin: Secondary | ICD-10-CM | POA: Insufficient documentation

## 2022-11-11 DIAGNOSIS — L03115 Cellulitis of right lower limb: Secondary | ICD-10-CM | POA: Insufficient documentation

## 2022-11-11 LAB — CBC WITH DIFFERENTIAL/PLATELET
Abs Immature Granulocytes: 0.11 10*3/uL — ABNORMAL HIGH (ref 0.00–0.07)
Basophils Absolute: 0.1 10*3/uL (ref 0.0–0.1)
Basophils Relative: 0 %
Eosinophils Absolute: 0.1 10*3/uL (ref 0.0–0.5)
Eosinophils Relative: 1 %
HCT: 38.6 % — ABNORMAL LOW (ref 39.0–52.0)
Hemoglobin: 12.9 g/dL — ABNORMAL LOW (ref 13.0–17.0)
Immature Granulocytes: 1 %
Lymphocytes Relative: 13 %
Lymphs Abs: 1.4 10*3/uL (ref 0.7–4.0)
MCH: 30.5 pg (ref 26.0–34.0)
MCHC: 33.4 g/dL (ref 30.0–36.0)
MCV: 91.3 fL (ref 80.0–100.0)
Monocytes Absolute: 0.8 10*3/uL (ref 0.1–1.0)
Monocytes Relative: 7 %
Neutro Abs: 8.7 10*3/uL — ABNORMAL HIGH (ref 1.7–7.7)
Neutrophils Relative %: 78 %
Platelets: 247 10*3/uL (ref 150–400)
RBC: 4.23 MIL/uL (ref 4.22–5.81)
RDW: 13.7 % (ref 11.5–15.5)
WBC: 11.2 10*3/uL — ABNORMAL HIGH (ref 4.0–10.5)
nRBC: 0 % (ref 0.0–0.2)

## 2022-11-11 LAB — COMPREHENSIVE METABOLIC PANEL
ALT: 218 U/L — ABNORMAL HIGH (ref 0–44)
AST: 152 U/L — ABNORMAL HIGH (ref 15–41)
Albumin: 3 g/dL — ABNORMAL LOW (ref 3.5–5.0)
Alkaline Phosphatase: 206 U/L — ABNORMAL HIGH (ref 38–126)
Anion gap: 12 (ref 5–15)
BUN: 9 mg/dL (ref 8–23)
CO2: 27 mmol/L (ref 22–32)
Calcium: 9 mg/dL (ref 8.9–10.3)
Chloride: 97 mmol/L — ABNORMAL LOW (ref 98–111)
Creatinine, Ser: 1.14 mg/dL (ref 0.61–1.24)
GFR, Estimated: >60 mL/min (ref >60)
Glucose, Bld: 113 mg/dL — ABNORMAL HIGH (ref 70–99)
Potassium: 3.6 mmol/L (ref 3.5–5.1)
Sodium: 136 mmol/L (ref 135–145)
Total Bilirubin: 0.3 mg/dL (ref 0.3–1.2)
Total Protein: 8.3 g/dL — ABNORMAL HIGH (ref 6.5–8.1)

## 2022-11-11 MED ORDER — CEPHALEXIN 500 MG PO CAPS: 500.0000 mg | ORAL_CAPSULE | Freq: Four times a day (QID) | ORAL | 0 refills | Status: AC

## 2022-11-11 MED ORDER — OXYCODONE-ACETAMINOPHEN 5-325 MG PO TABS
2.0000 | ORAL_TABLET | Freq: Once | ORAL | Status: AC
  Administered 2022-11-11: 2 via ORAL
  Filled 2022-11-11: qty 2

## 2022-11-11 MED ORDER — MELOXICAM 15 MG PO TABS: 15.0000 mg | ORAL_TABLET | Freq: Every day | ORAL | 0 refills | Status: AC

## 2022-11-11 MED ORDER — STERILE WATER FOR INJECTION IJ SOLN
INTRAMUSCULAR | Status: AC
  Filled 2022-11-11: qty 10

## 2022-11-11 MED ORDER — CEFTRIAXONE SODIUM 1 G IJ SOLR
1.0000 g | Freq: Once | INTRAMUSCULAR | Status: AC
Start: 2022-11-11 — End: 2022-11-11
  Administered 2022-11-11: 1 g via INTRAMUSCULAR
  Filled 2022-11-11: qty 10

## 2022-11-11 NOTE — ED Provider Notes (Signed)
[facility] Provider Note   CSN: [id number] Arrival date & time: 11/11/22  0304     History {Add pertinent medical, surgical, social history, OB history to HPI:1} Chief Complaint  Patient presents with   Joint Swelling    Jason Bowman is a 67 y.o. male.  67 year old male who presents ER today with right ankle redness and swelling.  Patient states that started spontaneously couple days ago progressively worsening.  No fevers.  Thinks maybe a spider bit him but Jason Bowman actually never saw this.  No history of the same.  No drainage.  No injuries to the area.  No fevers.  No recent illnesses.  No systemic symptoms.        Home Medications Prior to Admission medications   Medication Sig Start Date End Date Taking? Authorizing Provider  aspirin 81 MG tablet Take 81 mg by mouth daily.      [provider]  budesonide (RHINOCORT AQUA) 32 MCG/ACT nasal spray Place 1 spray into the nose daily.     [provider]  esomeprazole (NEXIUM) 40 MG capsule Take 40 mg by mouth 2 (two) times daily before a meal.    [provider]  ezetimibe-simvastatin (VYTORIN) 10-20 MG per tablet Take 1 tablet by mouth at bedtime. 08/05/12   Hendricks Limes, MD  ezetimibe-simvastatin (VYTORIN) 10-20 MG tablet Take 1 tablet by mouth at bedtime. -- Must have Office Visit with new PCP for further refills. 09/12/15   Hendricks Limes, MD  fexofenadine (ALLEGRA) 180 MG tablet Take 180 mg by mouth daily.      [provider]  Multiple Vitamin (MULTIVITAMIN) tablet Take 1 tablet by mouth daily.      [provider]      Allergies    Celecoxib and Rosuvastatin    Review of Systems   Review of Systems  Physical Exam Updated Vital Signs BP (!) 164/96   Pulse (!) 109   Temp 99.7 F (37.6 C) (Oral)   Resp 16   Ht '5\' 7"'$  (1.702 m)   Wt 72.6 kg   SpO2 100%   BMI 25.06 kg/m  Physical Exam Vitals and nursing note  reviewed.  Constitutional:      Appearance: Jason Bowman is well-developed.  HENT:     Head: Normocephalic and atraumatic.  Eyes:     Pupils: Pupils are equal, round, and reactive to light.  Cardiovascular:     Rate and Rhythm: Normal rate.  Pulmonary:     Effort: Pulmonary effort is normal. No respiratory distress.  Abdominal:     General: There is no distension.  Musculoskeletal:        General: Normal range of motion.     Cervical back: Normal range of motion.  Skin:    General: Skin is warm and dry.     Findings: Erythema (To right lateral ankle with mild edema.  No open wound.  No fluctuance.  Mild induration and tenderness as well.  Warm.) present.  Neurological:     General: No focal deficit present.     Mental Status: Jason Bowman is alert.     ED Results / Procedures / Treatments   Labs (all labs ordered are listed, but only abnormal results are displayed) Labs Reviewed  CBC WITH DIFFERENTIAL/PLATELET  COMPREHENSIVE METABOLIC PANEL    EKG None  Radiology DG Ankle Complete Right  Result Date: 11/11/2022 CLINICAL DATA:  67 year old male with swelling. EXAM: RIGHT ANKLE - COMPLETE  3+ VIEW COMPARISON:  None. FINDINGS: Soft tissue swelling with No acute osseous abnormality identified. Bone mineralization is within normal limits. Mortise joint alignment maintained. No evidence of ankle joint effusion. No acute osseous abnormality identified. Degenerative spurring at the calcaneus and along the dorsal midfoot, TMT joints. IMPRESSION: Lateral soft tissue swelling with no acute osseous abnormality identified. Electronically Signed   By: Genevie Ann M.D.   On: 11/11/2022 04:14    Procedures Procedures  {Document cardiac monitor, telemetry assessment procedure when appropriate:1}  Medications Ordered in ED Medications  cefTRIAXone (ROCEPHIN) injection 1 g (has no administration in time range)  sterile water (preservative free) injection (has no administration in time range)   oxyCODONE-acetaminophen (PERCOCET/ROXICET) 5-325 MG per tablet 2 tablet (2 tablets Oral Given 11/11/22 0414)    ED Course/ Medical Decision Making/ A&P   {   Click here for ABCD2, HEART and other calculatorsREFRESH Note before signing :1}                          Medical Decision Making Amount and/or Complexity of Data Reviewed Labs: ordered. Radiology: ordered.  Risk Prescription drug management.   ***  {Document critical care time when appropriate:1} {Document review of labs and clinical decision tools ie heart score, Chads2Vasc2 etc:1}  {Document your independent review of radiology images, and any outside records:1} {Document your discussion with family members, caretakers, and with consultants:1} {Document social determinants of health affecting pt's care:1} {Document your decision making why or why not admission, treatments were needed:1} Final Clinical Impression(s) / ED Diagnoses Final diagnoses:  None    Rx / DC Orders ED Discharge Orders     None

## 2022-11-11 NOTE — ED Notes (Signed)
Patient transported to X-ray 

## 2022-11-11 NOTE — Discharge Instructions (Addendum)
AST 15 - 41 U/L 152 High  33 R   37 R  37 R  ALT 0 - 44 U/L 218 High  2         You have an area of infected skin called cellulitis.  I have started you on antibiotics.  These antibiotics do not work instantaneously.   - You should allow at least 48 hours after your first dose for the cellulitis to slow it's spreading.   - You should allow 48 hours after the first dose to see the cellulitis to start improving.   - These things may happen quicker than that but allow at least these limitations prior to returning to the emergency department or your primary doctor.   - The exceptions to this are if you become systemically ill such as having a fever, nausea, vomiting, malaise.  - The other exception is if you have a rapid spreading of the cellulitis or the redness and swelling and pain.  If you notice more than a couple inches of spreading over a couple hours you need to return to the emergency department immediately because this could be a life-threatening infection. If you notice red streaking from the site you also need to return for reevaluation.  - If not, follow up with your primary doctor as instructed for reevaluation.

## 2022-11-11 NOTE — ED Notes (Signed)
Pt returned from xray

## 2022-11-11 NOTE — ED Triage Notes (Signed)
C/o pain right ankle with swelling , states he thinks he was bitten by a spider 2 days ago

## 2023-10-17 DIAGNOSIS — D124 Benign neoplasm of descending colon: Secondary | ICD-10-CM | POA: Diagnosis not present

## 2023-10-17 DIAGNOSIS — Z09 Encounter for follow-up examination after completed treatment for conditions other than malignant neoplasm: Secondary | ICD-10-CM | POA: Diagnosis not present

## 2023-10-17 DIAGNOSIS — Z860101 Personal history of adenomatous and serrated colon polyps: Secondary | ICD-10-CM | POA: Diagnosis not present

## 2023-10-17 DIAGNOSIS — K589 Irritable bowel syndrome without diarrhea: Secondary | ICD-10-CM | POA: Diagnosis not present

## 2023-10-21 DIAGNOSIS — D124 Benign neoplasm of descending colon: Secondary | ICD-10-CM | POA: Diagnosis not present

## 2023-11-25 DIAGNOSIS — K219 Gastro-esophageal reflux disease without esophagitis: Secondary | ICD-10-CM | POA: Diagnosis not present

## 2023-11-25 DIAGNOSIS — E7800 Pure hypercholesterolemia, unspecified: Secondary | ICD-10-CM | POA: Diagnosis not present

## 2023-11-25 DIAGNOSIS — R7303 Prediabetes: Secondary | ICD-10-CM | POA: Diagnosis not present

## 2024-01-06 DIAGNOSIS — M9901 Segmental and somatic dysfunction of cervical region: Secondary | ICD-10-CM | POA: Diagnosis not present

## 2024-01-06 DIAGNOSIS — M5022 Other cervical disc displacement, mid-cervical region, unspecified level: Secondary | ICD-10-CM | POA: Diagnosis not present

## 2024-01-06 DIAGNOSIS — M5126 Other intervertebral disc displacement, lumbar region: Secondary | ICD-10-CM | POA: Diagnosis not present

## 2024-01-06 DIAGNOSIS — M9903 Segmental and somatic dysfunction of lumbar region: Secondary | ICD-10-CM | POA: Diagnosis not present

## 2024-01-06 DIAGNOSIS — M9902 Segmental and somatic dysfunction of thoracic region: Secondary | ICD-10-CM | POA: Diagnosis not present

## 2024-01-06 DIAGNOSIS — M5124 Other intervertebral disc displacement, thoracic region: Secondary | ICD-10-CM | POA: Diagnosis not present

## 2024-01-20 DIAGNOSIS — H5213 Myopia, bilateral: Secondary | ICD-10-CM | POA: Diagnosis not present

## 2024-01-20 DIAGNOSIS — H2513 Age-related nuclear cataract, bilateral: Secondary | ICD-10-CM | POA: Diagnosis not present

## 2024-02-03 DIAGNOSIS — M9903 Segmental and somatic dysfunction of lumbar region: Secondary | ICD-10-CM | POA: Diagnosis not present

## 2024-02-03 DIAGNOSIS — M5415 Radiculopathy, thoracolumbar region: Secondary | ICD-10-CM | POA: Diagnosis not present

## 2024-02-03 DIAGNOSIS — M9902 Segmental and somatic dysfunction of thoracic region: Secondary | ICD-10-CM | POA: Diagnosis not present

## 2024-02-03 DIAGNOSIS — M9901 Segmental and somatic dysfunction of cervical region: Secondary | ICD-10-CM | POA: Diagnosis not present

## 2024-02-03 DIAGNOSIS — M4802 Spinal stenosis, cervical region: Secondary | ICD-10-CM | POA: Diagnosis not present

## 2024-02-03 DIAGNOSIS — M5417 Radiculopathy, lumbosacral region: Secondary | ICD-10-CM | POA: Diagnosis not present

## 2024-03-02 DIAGNOSIS — M9901 Segmental and somatic dysfunction of cervical region: Secondary | ICD-10-CM | POA: Diagnosis not present

## 2024-03-02 DIAGNOSIS — M5417 Radiculopathy, lumbosacral region: Secondary | ICD-10-CM | POA: Diagnosis not present

## 2024-03-02 DIAGNOSIS — M9903 Segmental and somatic dysfunction of lumbar region: Secondary | ICD-10-CM | POA: Diagnosis not present

## 2024-03-02 DIAGNOSIS — M4802 Spinal stenosis, cervical region: Secondary | ICD-10-CM | POA: Diagnosis not present

## 2024-03-02 DIAGNOSIS — M9902 Segmental and somatic dysfunction of thoracic region: Secondary | ICD-10-CM | POA: Diagnosis not present

## 2024-03-02 DIAGNOSIS — M5415 Radiculopathy, thoracolumbar region: Secondary | ICD-10-CM | POA: Diagnosis not present

## 2024-04-06 DIAGNOSIS — M9901 Segmental and somatic dysfunction of cervical region: Secondary | ICD-10-CM | POA: Diagnosis not present

## 2024-04-06 DIAGNOSIS — M9902 Segmental and somatic dysfunction of thoracic region: Secondary | ICD-10-CM | POA: Diagnosis not present

## 2024-04-06 DIAGNOSIS — M9903 Segmental and somatic dysfunction of lumbar region: Secondary | ICD-10-CM | POA: Diagnosis not present

## 2024-04-06 DIAGNOSIS — M4802 Spinal stenosis, cervical region: Secondary | ICD-10-CM | POA: Diagnosis not present

## 2024-04-06 DIAGNOSIS — M5415 Radiculopathy, thoracolumbar region: Secondary | ICD-10-CM | POA: Diagnosis not present

## 2024-04-06 DIAGNOSIS — M5417 Radiculopathy, lumbosacral region: Secondary | ICD-10-CM | POA: Diagnosis not present

## 2024-04-27 DIAGNOSIS — M9901 Segmental and somatic dysfunction of cervical region: Secondary | ICD-10-CM | POA: Diagnosis not present

## 2024-04-27 DIAGNOSIS — M4802 Spinal stenosis, cervical region: Secondary | ICD-10-CM | POA: Diagnosis not present

## 2024-04-27 DIAGNOSIS — M5415 Radiculopathy, thoracolumbar region: Secondary | ICD-10-CM | POA: Diagnosis not present

## 2024-04-27 DIAGNOSIS — M9903 Segmental and somatic dysfunction of lumbar region: Secondary | ICD-10-CM | POA: Diagnosis not present

## 2024-04-27 DIAGNOSIS — M5417 Radiculopathy, lumbosacral region: Secondary | ICD-10-CM | POA: Diagnosis not present

## 2024-04-27 DIAGNOSIS — M9902 Segmental and somatic dysfunction of thoracic region: Secondary | ICD-10-CM | POA: Diagnosis not present

## 2024-05-25 DIAGNOSIS — M5415 Radiculopathy, thoracolumbar region: Secondary | ICD-10-CM | POA: Diagnosis not present

## 2024-05-25 DIAGNOSIS — M9903 Segmental and somatic dysfunction of lumbar region: Secondary | ICD-10-CM | POA: Diagnosis not present

## 2024-05-25 DIAGNOSIS — M4802 Spinal stenosis, cervical region: Secondary | ICD-10-CM | POA: Diagnosis not present

## 2024-05-25 DIAGNOSIS — M5417 Radiculopathy, lumbosacral region: Secondary | ICD-10-CM | POA: Diagnosis not present

## 2024-05-25 DIAGNOSIS — M9901 Segmental and somatic dysfunction of cervical region: Secondary | ICD-10-CM | POA: Diagnosis not present

## 2024-05-25 DIAGNOSIS — M9902 Segmental and somatic dysfunction of thoracic region: Secondary | ICD-10-CM | POA: Diagnosis not present

## 2024-06-09 DIAGNOSIS — Z1331 Encounter for screening for depression: Secondary | ICD-10-CM | POA: Diagnosis not present

## 2024-06-09 DIAGNOSIS — E7800 Pure hypercholesterolemia, unspecified: Secondary | ICD-10-CM | POA: Diagnosis not present

## 2024-06-09 DIAGNOSIS — K5900 Constipation, unspecified: Secondary | ICD-10-CM | POA: Diagnosis not present

## 2024-06-09 DIAGNOSIS — R7303 Prediabetes: Secondary | ICD-10-CM | POA: Diagnosis not present

## 2024-06-09 DIAGNOSIS — Z125 Encounter for screening for malignant neoplasm of prostate: Secondary | ICD-10-CM | POA: Diagnosis not present

## 2024-06-09 DIAGNOSIS — Z0000 Encounter for general adult medical examination without abnormal findings: Secondary | ICD-10-CM | POA: Diagnosis not present

## 2024-06-09 DIAGNOSIS — Z23 Encounter for immunization: Secondary | ICD-10-CM | POA: Diagnosis not present

## 2024-06-09 DIAGNOSIS — L918 Other hypertrophic disorders of the skin: Secondary | ICD-10-CM | POA: Diagnosis not present

## 2024-06-09 DIAGNOSIS — N529 Male erectile dysfunction, unspecified: Secondary | ICD-10-CM | POA: Diagnosis not present

## 2024-06-29 DIAGNOSIS — M9902 Segmental and somatic dysfunction of thoracic region: Secondary | ICD-10-CM | POA: Diagnosis not present

## 2024-06-29 DIAGNOSIS — M9903 Segmental and somatic dysfunction of lumbar region: Secondary | ICD-10-CM | POA: Diagnosis not present

## 2024-06-29 DIAGNOSIS — M5417 Radiculopathy, lumbosacral region: Secondary | ICD-10-CM | POA: Diagnosis not present

## 2024-06-29 DIAGNOSIS — M5415 Radiculopathy, thoracolumbar region: Secondary | ICD-10-CM | POA: Diagnosis not present

## 2024-06-29 DIAGNOSIS — M4802 Spinal stenosis, cervical region: Secondary | ICD-10-CM | POA: Diagnosis not present

## 2024-06-29 DIAGNOSIS — M9901 Segmental and somatic dysfunction of cervical region: Secondary | ICD-10-CM | POA: Diagnosis not present

## 2024-07-23 ENCOUNTER — Other Ambulatory Visit: Payer: Self-pay | Admitting: Hematology & Oncology

## 2024-07-27 DIAGNOSIS — M4802 Spinal stenosis, cervical region: Secondary | ICD-10-CM | POA: Diagnosis not present

## 2024-07-27 DIAGNOSIS — M9903 Segmental and somatic dysfunction of lumbar region: Secondary | ICD-10-CM | POA: Diagnosis not present

## 2024-07-27 DIAGNOSIS — M9901 Segmental and somatic dysfunction of cervical region: Secondary | ICD-10-CM | POA: Diagnosis not present

## 2024-07-27 DIAGNOSIS — M5415 Radiculopathy, thoracolumbar region: Secondary | ICD-10-CM | POA: Diagnosis not present

## 2024-07-27 DIAGNOSIS — M9902 Segmental and somatic dysfunction of thoracic region: Secondary | ICD-10-CM | POA: Diagnosis not present

## 2024-07-27 DIAGNOSIS — M5417 Radiculopathy, lumbosacral region: Secondary | ICD-10-CM | POA: Diagnosis not present

## 2024-08-24 DIAGNOSIS — M5417 Radiculopathy, lumbosacral region: Secondary | ICD-10-CM | POA: Diagnosis not present

## 2024-08-24 DIAGNOSIS — M4802 Spinal stenosis, cervical region: Secondary | ICD-10-CM | POA: Diagnosis not present

## 2024-08-24 DIAGNOSIS — M9902 Segmental and somatic dysfunction of thoracic region: Secondary | ICD-10-CM | POA: Diagnosis not present

## 2024-08-24 DIAGNOSIS — M9901 Segmental and somatic dysfunction of cervical region: Secondary | ICD-10-CM | POA: Diagnosis not present

## 2024-08-24 DIAGNOSIS — M9903 Segmental and somatic dysfunction of lumbar region: Secondary | ICD-10-CM | POA: Diagnosis not present

## 2024-08-24 DIAGNOSIS — M5415 Radiculopathy, thoracolumbar region: Secondary | ICD-10-CM | POA: Diagnosis not present
